# Patient Record
Sex: Male | Born: 1935 | Race: White | Hispanic: No | Marital: Married | Smoking: Former smoker
Health system: Southern US, Community
[De-identification: ages and names within clinical notes are randomized; demographics above are authoritative.]

## PROBLEM LIST (undated history)

## (undated) DIAGNOSIS — H3530 Unspecified macular degeneration: Secondary | ICD-10-CM

## (undated) DIAGNOSIS — M4800 Spinal stenosis, site unspecified: Secondary | ICD-10-CM

## (undated) DIAGNOSIS — I712 Thoracic aortic aneurysm, without rupture, unspecified: Secondary | ICD-10-CM

## (undated) DIAGNOSIS — I10 Essential (primary) hypertension: Secondary | ICD-10-CM

## (undated) DIAGNOSIS — I714 Abdominal aortic aneurysm, without rupture, unspecified: Secondary | ICD-10-CM

## (undated) DIAGNOSIS — I2510 Atherosclerotic heart disease of native coronary artery without angina pectoris: Secondary | ICD-10-CM

## (undated) DIAGNOSIS — I739 Peripheral vascular disease, unspecified: Secondary | ICD-10-CM

## (undated) DIAGNOSIS — R9431 Abnormal electrocardiogram [ECG] [EKG]: Secondary | ICD-10-CM

## (undated) DIAGNOSIS — E785 Hyperlipidemia, unspecified: Secondary | ICD-10-CM

## (undated) DIAGNOSIS — I219 Acute myocardial infarction, unspecified: Secondary | ICD-10-CM

## (undated) HISTORY — DX: Abdominal aortic aneurysm, without rupture, unspecified: I71.40

## (undated) HISTORY — DX: Hyperlipidemia, unspecified: E78.5

## (undated) HISTORY — DX: Thoracic aortic aneurysm, without rupture, unspecified: I71.20

## (undated) HISTORY — DX: Acute myocardial infarction, unspecified: I21.9

## (undated) HISTORY — PX: CATARACT EXTRACTION: SUR2

## (undated) HISTORY — DX: Abdominal aortic aneurysm, without rupture: I71.4

## (undated) HISTORY — DX: Atherosclerotic heart disease of native coronary artery without angina pectoris: I25.10

## (undated) HISTORY — DX: Abnormal electrocardiogram (ECG) (EKG): R94.31

## (undated) HISTORY — DX: Thoracic aortic aneurysm, without rupture: I71.2

## (undated) HISTORY — DX: Unspecified macular degeneration: H35.30

## (undated) HISTORY — DX: Spinal stenosis, site unspecified: M48.00

## (undated) HISTORY — DX: Essential (primary) hypertension: I10

## (undated) HISTORY — DX: Peripheral vascular disease, unspecified: I73.9

## (undated) HISTORY — PX: APPENDECTOMY: SHX54

---

## 1987-08-07 HISTORY — PX: OTHER SURGICAL HISTORY: SHX169

## 1993-08-06 HISTORY — PX: OTHER SURGICAL HISTORY: SHX169

## 1998-12-09 ENCOUNTER — Ambulatory Visit (HOSPITAL_COMMUNITY): Admission: RE | Admit: 1998-12-09 | Discharge: 1998-12-09 | Payer: Self-pay | Admitting: Gastroenterology

## 2004-05-11 ENCOUNTER — Ambulatory Visit (HOSPITAL_COMMUNITY): Admission: RE | Admit: 2004-05-11 | Discharge: 2004-05-11 | Payer: Self-pay | Admitting: Family Medicine

## 2004-08-14 ENCOUNTER — Ambulatory Visit: Payer: Self-pay | Admitting: Family Medicine

## 2004-09-01 ENCOUNTER — Ambulatory Visit: Payer: Self-pay | Admitting: Family Medicine

## 2004-09-26 ENCOUNTER — Ambulatory Visit: Payer: Self-pay | Admitting: Family Medicine

## 2004-10-02 ENCOUNTER — Ambulatory Visit: Payer: Self-pay | Admitting: Family Medicine

## 2004-10-30 ENCOUNTER — Ambulatory Visit: Payer: Self-pay | Admitting: Family Medicine

## 2005-01-09 ENCOUNTER — Ambulatory Visit: Payer: Self-pay | Admitting: Family Medicine

## 2005-04-19 ENCOUNTER — Ambulatory Visit: Payer: Self-pay | Admitting: Family Medicine

## 2005-04-30 ENCOUNTER — Ambulatory Visit: Payer: Self-pay | Admitting: Family Medicine

## 2005-06-11 ENCOUNTER — Ambulatory Visit: Payer: Self-pay | Admitting: Family Medicine

## 2005-08-06 DIAGNOSIS — I219 Acute myocardial infarction, unspecified: Secondary | ICD-10-CM

## 2005-08-06 HISTORY — DX: Acute myocardial infarction, unspecified: I21.9

## 2005-10-16 ENCOUNTER — Ambulatory Visit: Payer: Self-pay | Admitting: Family Medicine

## 2005-11-06 ENCOUNTER — Ambulatory Visit: Payer: Self-pay | Admitting: Family Medicine

## 2005-11-15 ENCOUNTER — Ambulatory Visit: Payer: Self-pay | Admitting: *Deleted

## 2005-11-16 ENCOUNTER — Inpatient Hospital Stay (HOSPITAL_COMMUNITY): Admission: EM | Admit: 2005-11-16 | Discharge: 2005-11-19 | Payer: Self-pay | Admitting: Emergency Medicine

## 2005-11-28 ENCOUNTER — Ambulatory Visit: Payer: Self-pay | Admitting: Family Medicine

## 2005-12-05 ENCOUNTER — Ambulatory Visit: Payer: Self-pay | Admitting: Cardiology

## 2005-12-25 ENCOUNTER — Ambulatory Visit: Payer: Self-pay | Admitting: Cardiology

## 2006-01-08 ENCOUNTER — Ambulatory Visit: Payer: Self-pay | Admitting: Cardiology

## 2006-01-29 ENCOUNTER — Ambulatory Visit: Payer: Self-pay | Admitting: Cardiology

## 2006-02-15 ENCOUNTER — Ambulatory Visit: Payer: Self-pay | Admitting: Family Medicine

## 2006-02-28 ENCOUNTER — Ambulatory Visit: Payer: Self-pay | Admitting: Family Medicine

## 2006-06-05 ENCOUNTER — Ambulatory Visit: Payer: Self-pay | Admitting: Family Medicine

## 2006-07-01 ENCOUNTER — Ambulatory Visit: Payer: Self-pay | Admitting: Family Medicine

## 2006-07-04 ENCOUNTER — Ambulatory Visit: Payer: Self-pay | Admitting: Cardiology

## 2006-10-07 ENCOUNTER — Ambulatory Visit: Payer: Self-pay | Admitting: Family Medicine

## 2006-10-22 ENCOUNTER — Ambulatory Visit: Payer: Self-pay | Admitting: Family Medicine

## 2006-10-29 ENCOUNTER — Ambulatory Visit: Payer: Self-pay | Admitting: Family Medicine

## 2006-11-19 ENCOUNTER — Encounter: Admission: RE | Admit: 2006-11-19 | Discharge: 2006-11-19 | Payer: Self-pay | Admitting: Gastroenterology

## 2006-12-03 ENCOUNTER — Ambulatory Visit: Payer: Self-pay | Admitting: Family Medicine

## 2006-12-25 ENCOUNTER — Ambulatory Visit: Payer: Self-pay | Admitting: Vascular Surgery

## 2007-01-20 ENCOUNTER — Ambulatory Visit: Payer: Self-pay | Admitting: Family Medicine

## 2007-02-18 ENCOUNTER — Inpatient Hospital Stay (HOSPITAL_COMMUNITY): Admission: EM | Admit: 2007-02-18 | Discharge: 2007-02-20 | Payer: Self-pay | Admitting: Emergency Medicine

## 2007-02-18 ENCOUNTER — Encounter: Payer: Self-pay | Admitting: Cardiology

## 2007-02-18 ENCOUNTER — Ambulatory Visit: Payer: Self-pay | Admitting: Cardiology

## 2007-03-04 ENCOUNTER — Ambulatory Visit: Payer: Self-pay | Admitting: Family Medicine

## 2007-03-28 ENCOUNTER — Ambulatory Visit: Payer: Self-pay | Admitting: Cardiology

## 2007-05-05 ENCOUNTER — Encounter: Payer: Self-pay | Admitting: Cardiology

## 2007-07-01 ENCOUNTER — Encounter: Admission: RE | Admit: 2007-07-01 | Discharge: 2007-07-01 | Payer: Self-pay | Admitting: Vascular Surgery

## 2007-07-01 ENCOUNTER — Ambulatory Visit: Payer: Self-pay | Admitting: Vascular Surgery

## 2007-08-07 HISTORY — PX: JOINT REPLACEMENT: SHX530

## 2007-10-16 ENCOUNTER — Ambulatory Visit: Payer: Self-pay | Admitting: Cardiology

## 2007-10-17 ENCOUNTER — Ambulatory Visit: Payer: Self-pay | Admitting: Cardiology

## 2007-12-01 ENCOUNTER — Inpatient Hospital Stay (HOSPITAL_COMMUNITY): Admission: RE | Admit: 2007-12-01 | Discharge: 2007-12-05 | Payer: Self-pay | Admitting: Orthopedic Surgery

## 2008-03-16 ENCOUNTER — Inpatient Hospital Stay (HOSPITAL_COMMUNITY): Admission: RE | Admit: 2008-03-16 | Discharge: 2008-03-19 | Payer: Self-pay | Admitting: Orthopedic Surgery

## 2008-05-25 ENCOUNTER — Encounter: Admission: RE | Admit: 2008-05-25 | Discharge: 2008-05-25 | Payer: Self-pay | Admitting: Vascular Surgery

## 2008-05-25 ENCOUNTER — Ambulatory Visit: Payer: Self-pay | Admitting: Vascular Surgery

## 2008-07-22 ENCOUNTER — Ambulatory Visit: Payer: Self-pay | Admitting: Cardiology

## 2008-08-25 ENCOUNTER — Encounter: Admission: RE | Admit: 2008-08-25 | Discharge: 2008-08-25 | Payer: Self-pay | Admitting: Allergy

## 2008-11-30 ENCOUNTER — Encounter: Admission: RE | Admit: 2008-11-30 | Discharge: 2008-11-30 | Payer: Self-pay | Admitting: Vascular Surgery

## 2008-11-30 ENCOUNTER — Ambulatory Visit: Payer: Self-pay | Admitting: Vascular Surgery

## 2008-12-27 ENCOUNTER — Encounter: Payer: Self-pay | Admitting: Cardiology

## 2008-12-28 ENCOUNTER — Encounter: Payer: Self-pay | Admitting: Cardiology

## 2008-12-29 ENCOUNTER — Encounter: Payer: Self-pay | Admitting: Cardiology

## 2009-02-22 DIAGNOSIS — I10 Essential (primary) hypertension: Secondary | ICD-10-CM

## 2009-02-22 DIAGNOSIS — E785 Hyperlipidemia, unspecified: Secondary | ICD-10-CM

## 2009-02-22 DIAGNOSIS — R9431 Abnormal electrocardiogram [ECG] [EKG]: Secondary | ICD-10-CM

## 2009-02-22 DIAGNOSIS — I2510 Atherosclerotic heart disease of native coronary artery without angina pectoris: Secondary | ICD-10-CM

## 2009-02-22 DIAGNOSIS — I716 Thoracoabdominal aortic aneurysm, without rupture: Secondary | ICD-10-CM

## 2009-02-22 IMAGING — CR DG PORTABLE PELVIS
1 series · 1 of 1 positions shown · non-contrast
Comparison: None

CLINICAL DATA: Osteoarthritis right hip.  Right hip replacement.

PORTABLE PELVIS

[view not recorded]
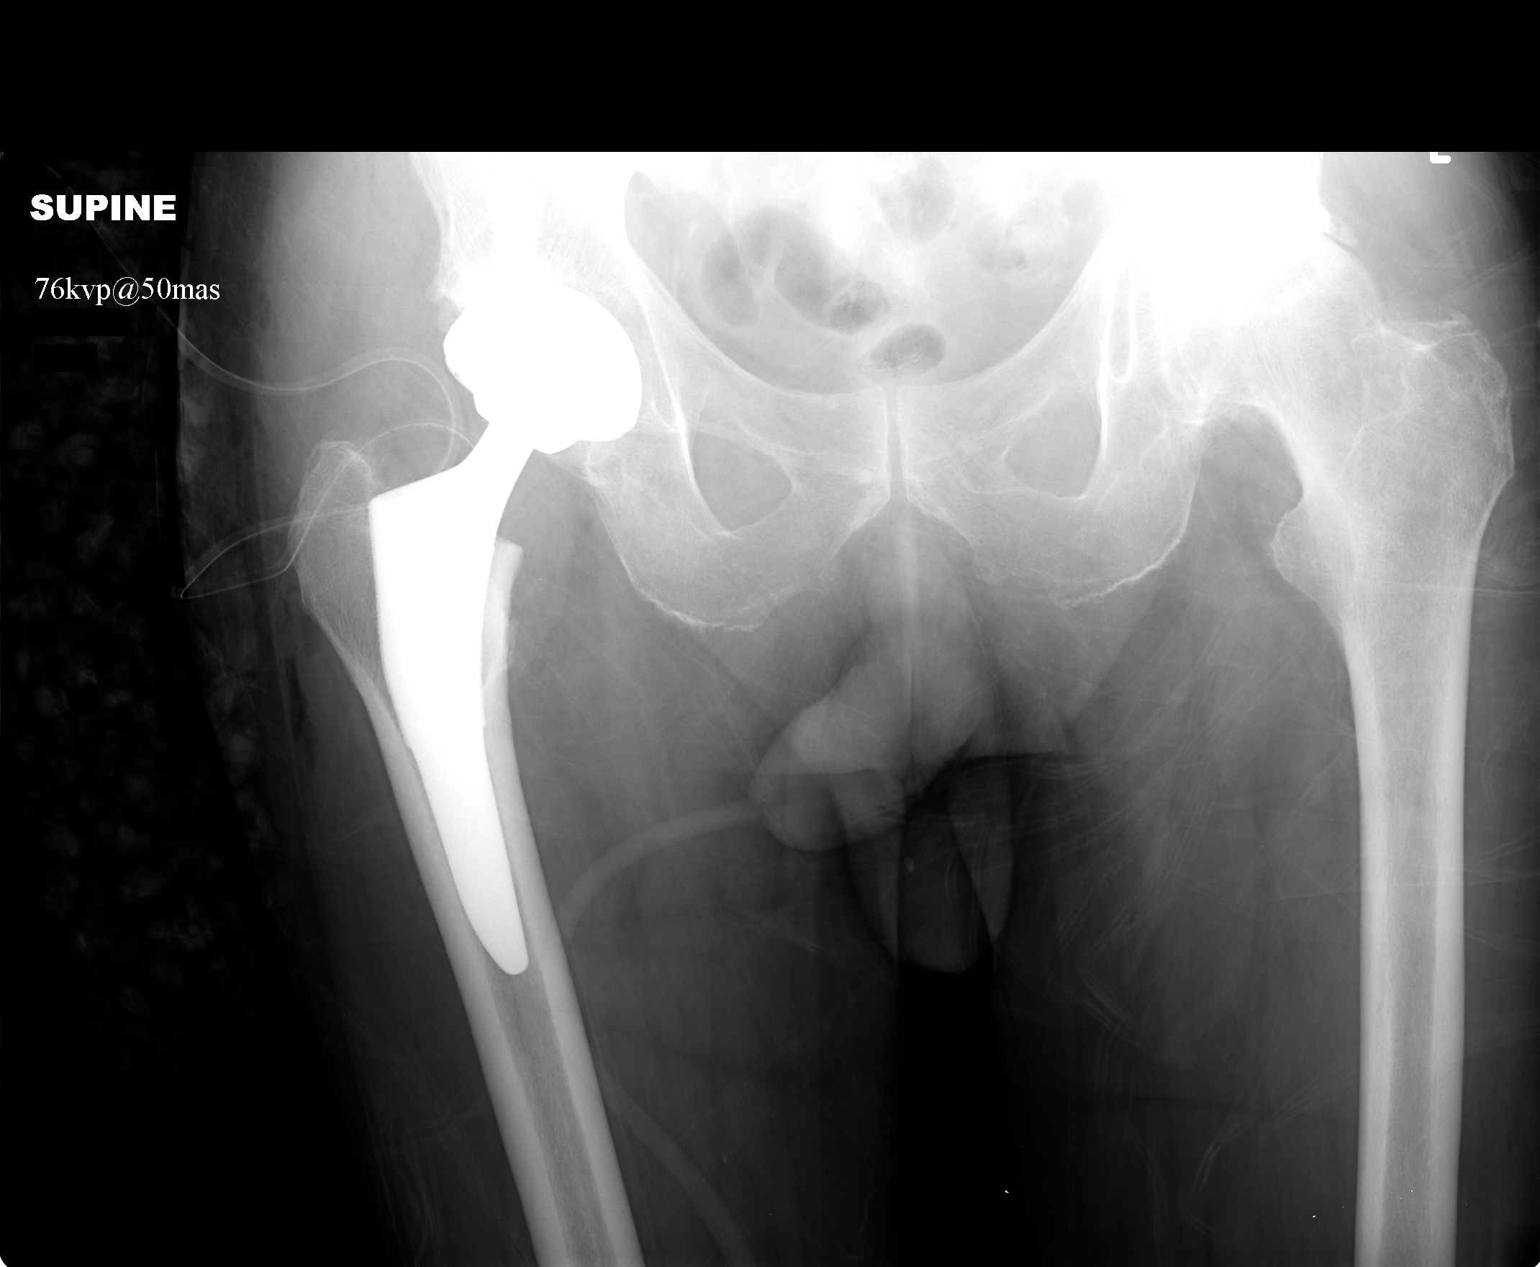

[1 of 1 positions shown; findings below may reference images not displayed]

FINDINGS: Right hip replacement has been performed with
satisfactory position of the acetabular and femoral prosthesis.
There is no fracture or dislocation.

Moderate to advanced degenerative changes are also noted in the
left hip.
IMPRESSION: Satisfactory right hip replacement

## 2009-05-10 ENCOUNTER — Ambulatory Visit: Payer: Self-pay | Admitting: Cardiology

## 2009-06-01 ENCOUNTER — Ambulatory Visit: Payer: Self-pay | Admitting: Vascular Surgery

## 2009-09-01 ENCOUNTER — Telehealth (INDEPENDENT_AMBULATORY_CARE_PROVIDER_SITE_OTHER): Payer: Self-pay | Admitting: *Deleted

## 2009-10-04 ENCOUNTER — Inpatient Hospital Stay (HOSPITAL_COMMUNITY): Admission: EM | Admit: 2009-10-04 | Discharge: 2009-10-06 | Payer: Self-pay | Admitting: Emergency Medicine

## 2009-10-04 ENCOUNTER — Ambulatory Visit: Payer: Self-pay | Admitting: Cardiovascular Disease

## 2009-10-04 ENCOUNTER — Encounter: Payer: Self-pay | Admitting: Cardiology

## 2009-10-05 ENCOUNTER — Ambulatory Visit: Payer: Self-pay | Admitting: Vascular Surgery

## 2009-10-05 ENCOUNTER — Encounter: Payer: Self-pay | Admitting: Cardiology

## 2009-10-25 ENCOUNTER — Ambulatory Visit: Payer: Self-pay | Admitting: Cardiology

## 2009-10-25 DIAGNOSIS — I4891 Unspecified atrial fibrillation: Secondary | ICD-10-CM | POA: Insufficient documentation

## 2009-10-25 DIAGNOSIS — I714 Abdominal aortic aneurysm, without rupture, unspecified: Secondary | ICD-10-CM | POA: Insufficient documentation

## 2009-10-25 DIAGNOSIS — R0602 Shortness of breath: Secondary | ICD-10-CM

## 2009-10-26 ENCOUNTER — Encounter: Payer: Self-pay | Admitting: Cardiology

## 2009-10-31 ENCOUNTER — Encounter: Payer: Self-pay | Admitting: Cardiology

## 2009-11-01 ENCOUNTER — Encounter (INDEPENDENT_AMBULATORY_CARE_PROVIDER_SITE_OTHER): Payer: Self-pay | Admitting: *Deleted

## 2009-11-03 ENCOUNTER — Encounter: Payer: Self-pay | Admitting: Cardiology

## 2009-11-09 ENCOUNTER — Encounter: Payer: Self-pay | Admitting: Cardiology

## 2009-11-09 ENCOUNTER — Telehealth (INDEPENDENT_AMBULATORY_CARE_PROVIDER_SITE_OTHER): Payer: Self-pay | Admitting: *Deleted

## 2009-11-09 DIAGNOSIS — R930 Abnormal findings on diagnostic imaging of skull and head, not elsewhere classified: Secondary | ICD-10-CM | POA: Insufficient documentation

## 2009-11-18 ENCOUNTER — Telehealth (INDEPENDENT_AMBULATORY_CARE_PROVIDER_SITE_OTHER): Payer: Self-pay | Admitting: *Deleted

## 2009-11-29 ENCOUNTER — Encounter: Payer: Self-pay | Admitting: Cardiology

## 2009-12-02 ENCOUNTER — Encounter: Payer: Self-pay | Admitting: Cardiology

## 2009-12-08 ENCOUNTER — Encounter: Payer: Self-pay | Admitting: Cardiology

## 2009-12-19 ENCOUNTER — Encounter: Payer: Self-pay | Admitting: Cardiology

## 2009-12-27 ENCOUNTER — Encounter: Payer: Self-pay | Admitting: Cardiology

## 2009-12-27 ENCOUNTER — Emergency Department (HOSPITAL_COMMUNITY): Admission: EM | Admit: 2009-12-27 | Discharge: 2009-12-27 | Payer: Self-pay | Admitting: Emergency Medicine

## 2009-12-28 ENCOUNTER — Encounter: Payer: Self-pay | Admitting: Cardiology

## 2010-01-05 ENCOUNTER — Ambulatory Visit: Payer: Self-pay | Admitting: Physician Assistant

## 2010-01-05 ENCOUNTER — Encounter: Payer: Self-pay | Admitting: Cardiology

## 2010-01-09 ENCOUNTER — Ambulatory Visit (HOSPITAL_COMMUNITY): Admission: RE | Admit: 2010-01-09 | Discharge: 2010-01-09 | Payer: Self-pay | Admitting: Ophthalmology

## 2010-01-12 ENCOUNTER — Ambulatory Visit (HOSPITAL_COMMUNITY): Admission: AD | Admit: 2010-01-12 | Discharge: 2010-01-13 | Payer: Self-pay | Admitting: Ophthalmology

## 2010-01-18 ENCOUNTER — Telehealth (INDEPENDENT_AMBULATORY_CARE_PROVIDER_SITE_OTHER): Payer: Self-pay | Admitting: *Deleted

## 2010-01-19 ENCOUNTER — Encounter: Payer: Self-pay | Admitting: Cardiology

## 2010-01-23 ENCOUNTER — Observation Stay (HOSPITAL_COMMUNITY): Admission: RE | Admit: 2010-01-23 | Discharge: 2010-01-24 | Payer: Self-pay | Admitting: Ophthalmology

## 2010-01-23 ENCOUNTER — Encounter: Payer: Self-pay | Admitting: Cardiology

## 2010-05-03 ENCOUNTER — Encounter: Payer: Self-pay | Admitting: Cardiology

## 2010-05-03 ENCOUNTER — Ambulatory Visit: Payer: Self-pay | Admitting: Vascular Surgery

## 2010-05-03 ENCOUNTER — Encounter: Admission: RE | Admit: 2010-05-03 | Discharge: 2010-05-03 | Payer: Self-pay | Admitting: Vascular Surgery

## 2010-05-16 ENCOUNTER — Ambulatory Visit: Payer: Self-pay | Admitting: Cardiology

## 2010-08-06 HISTORY — PX: ENUCLEATION: SHX628

## 2010-08-27 ENCOUNTER — Encounter: Payer: Self-pay | Admitting: Vascular Surgery

## 2010-09-07 NOTE — Progress Notes (Signed)
Summary: Office Visit/ VV OFFICE VISIT  Office Visit/ VV OFFICE VISIT   Imported By: Dorise Hiss 05/16/2010 10:15:31  _____________________________________________________________________  External Attachment:    Type:   Image     Comment:   External Document

## 2010-09-07 NOTE — Letter (Signed)
Summary: External Correspondence/ OFFICE VISIT DR. HENDERSON  External Correspondence/ OFFICE VISIT DR. HENDERSON   Imported By: Dorise Hiss 01/05/2010 14:22:33  _____________________________________________________________________  External Attachment:    Type:   Image     Comment:   External Document

## 2010-09-07 NOTE — Progress Notes (Signed)
Summary: pulmonology referral  ---- Converted from flag ---- ---- 10/25/2009 3:13 PM, Hoover Brunette, LPN wrote: If BNP normal, then referral to Pulmonology for shortness of breath & pulmonary infiltrates. ------------------------------  Phone Note Outgoing Call   Summary of Call: Appointment scheduled for:    Friday, April 29 at 3:00 with Dr. Orson Aloe.   Left message to return call.  Hoover Brunette, LPN  November 09, 4096 10:18 AM  Wife notified.      Initial call taken by: Hoover Brunette, LPN,  November 09, 2009 10:49 AM

## 2010-09-07 NOTE — Letter (Signed)
Summary: Engineer, materials at [facility]  [address]   [place], [place] [address]   Phone: [phone]  Fax: [phone]        November 01, 2009 MRN: [id number]   [name] [name] [address] [place], [place]  [address]   Dear [name],  Your test ordered by Selena Batten has been reviewed by your physician (or physician assistant) and was found to be normal or stable. Your physician (or physician assistant) felt no changes were needed at this time.  ____ Echocardiogram  ____ Cardiac Stress Test  __X__ Lab Work  ____ Peripheral vascular study of arms, legs or neck  ____ CT scan or X-ray  ____ Lung or Breathing test  ____ Other:   Thank you.   Hoover Brunette, LPN    Duane Boston, M.D., F.A.C.C. Thressa Sheller, M.D., F.A.C.C. Oneal Grout, M.D., F.A.C.C. Cheree Ditto, M.D., F.A.C.C. Daiva Nakayama, M.D., F.A.C.C. Kenney Houseman, M.D., F.A.C.C. Jeanne Ivan, PA-C

## 2010-09-07 NOTE — Progress Notes (Signed)
  Reqived Request from [facility] sent to [facility] Mould [facility]  January 18, 2010 3:17 PM    Appended Document:  Recieved paper back from [facility] for Doc to Initail paper from [employer]..I am unsure of who is to initial and so is Renee, I am sending the paper back to [facility] to [employer]

## 2010-09-07 NOTE — Medication Information (Signed)
Summary: RX Folder/ [employer] ANNUAL ASSESSMENT  RX Folder/ [facility] ANNUAL ASSESSMENT   Imported By: Dorise Hiss 01/11/2010 12:21:34  _____________________________________________________________________  External Attachment:    Type:   Image     Comment:   External Document

## 2010-09-07 NOTE — Medication Information (Signed)
Summary: RX Folder/ [facility] ([facility])  RX Folder/ [facility] ([facility])   Imported By: Dorise Hiss 01/19/2010 11:40:54  _____________________________________________________________________  External Attachment:    Type:   Image     Comment:   External Document
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
## 2010-09-07 NOTE — Miscellaneous (Signed)
Summary: Rehab Report/ CARDIAC REHAB DISCHARGE SUMMARY  Rehab Report/ CARDIAC REHAB DISCHARGE SUMMARY   Imported By: Dorise Hiss 01/26/2010 09:17:00  _____________________________________________________________________  External Attachment:    Type:   Image     Comment:   External Document

## 2010-09-07 NOTE — Miscellaneous (Signed)
Summary: Rehab Report/ CARDIAC REHAB PROGRESS REPORT  Rehab Report/ CARDIAC REHAB PROGRESS REPORT   Imported By: Dorise Hiss 11/04/2009 09:34:54  _____________________________________________________________________  External Attachment:    Type:   Image     Comment:   External Document

## 2010-09-07 NOTE — Medication Information (Signed)
Summary: RX Folder/ AMLODIPINE BESYLATE  RX Folder/ AMLODIPINE BESYLATE   Imported By: Dorise Hiss 12/20/2009 09:10:48  _____________________________________________________________________  External Attachment:    Type:   Image     Comment:   External Document

## 2010-09-07 NOTE — Letter (Signed)
Summary: External Correspondence  External Correspondence   Imported By: Zachary George 10/31/2009 09:41:32  _____________________________________________________________________  External Attachment:    Type:   Image     Comment:   External Document  Appended Document: External Correspondence patient is okay to start rehabilitation is not hypoxic anymore.  Appended Document: External Correspondence Note faxed back to chronic disease.

## 2010-09-07 NOTE — Assessment & Plan Note (Signed)
Summary: 2 wk post Hosp fu d/c [facility] 10/05/2009 vs   Visit Type:  Follow-up Primary Provider:  Dr Lysbeth Galas  CC:  hospital follow-up visit.  History of Present Illness: the patient is an 77 year old male with a history of thoracic abdominal aneurysm. The patient was recently admitted to [facility] for acute onset of social chest pain. The patient was referred for cardiac catheterization and was found to have severe distal left circumflex stenosis and underwent successful percutaneous intervention using a bare-metal stent. He also has mild-to-moderate left anterior descending and right coronary stenosis. The patient complains of shortness of breath on minimal exertion. He states he feels like "a rotten dog". The patient had a CT scan done at [facility] and now is found to have an enlarging aneurysm thoracic aneurysm is 4.3 cm and abdominal aneurysm is 5.4 cm. Of note also the patient was found to have posterior right upper lobe ground glass opacities and interlobular septal thickening of the lung bases.  The patient reports no lower extremity edema. He reports no palpitations presyncope or syncope. He states he feels fullness in his lower abdomen associated with bloating. He is constipated and has to use MiraLax.  The patient agreed episode of atrial fibrillation during presentation was acute coronary syndrome. He was placed on amiodarone therapy which was discontinued.  Preventive Screening-Counseling & Management  Alcohol-Tobacco     Smoking Status: quit     Year Quit: 1950  Current Medications (verified): 1)  Amlodipine Besylate 5 Mg Tabs (Amlodipine Besylate) .... Take 1 Tablet By Mouth Once A Day 2)  Loratadine 10 Mg Tabs (Loratadine) .... Take 1 Tablet By Mouth Once A Day 3)  Alphagan P 0.15 % Soln (Brimonidine Tartrate) .... One Gtt To Ou Two Times A Day 4)  Pred Mild 0.12 % Susp (Prednisolone Acetate) .... One Gtt Ou Daily 5)  Aspirin 81 Mg Tbec (Aspirin) .... Take 1  Tablet By Mouth Once A Day 6)  Macuvite Eye Care  Tabs (Multiple Vitamins-Minerals) .... Take 1 Tablet By Mouth Two Times A Day 7)  Prostate  Tabs (Specialty Vitamins Products) .... Take 1 Tablet By Mouth Once A Day 8)  Folbee 2.5-25-1 Mg Tabs (Folic Acid-Vit B6-Vit B12) .... Take 1 Tablet By Mouth Once A Day 9)  Multivitamins  Tabs (Multiple Vitamin) .... Take 1 Tablet By Mouth Once A Day 10)  Fish Oil 1200 Mg Caps (Omega-3 Fatty Acids) .... Take 1 Tablet By Mouth Once A Day 11)  Furosemide 20 Mg Tabs (Furosemide) .... Take 1 Tablet By Mouth Once A Day Times Three Days 12)  Flomax 0.4 Mg Caps (Tamsulosin Hcl) .... Take 1 Tablet By Mouth Once A Day 13)  Tylenol 325 Mg Tabs (Acetaminophen) .... Take One By Mouth Every Four Hours As Needed 14)  Plavix 75 Mg Tabs (Clopidogrel Bisulfate) .... Take 1 Tablet By Mouth Once A Day 15)  Nitrostat 0.4 Mg Subl (Nitroglycerin) .... Use As Directed 16)  Simvastatin 40 Mg Tabs (Simvastatin) .... Take 1 Tablet By Mouth Once A Day 17)  Dorzolamide Hcl 2 % Soln (Dorzolamide Hcl) .... One Drop Ou Two Times A Day  Allergies (verified): 1)  ! Cipro  Comments:  Nurse/Medical Assistant: The patient's medications and allergies were reviewed with the patient and were updated in the Medication and Allergy Lists. Bottles,list reviewed.  Past History:  Past Medical History: Last updated: 05/10/2009 HYPERLIPIDEMIA-MIXED (ICD-272.4) ABNORMAL EKG (ICD-794.31) HYPERTENSION, UNSPECIFIED (ICD-401.9) THORACIC AORTIC ANEURYSM (ICD-441.2) CAD, NATIVE VESSEL (ICD-414.01)  1. History of coronary artery disease with non-ST elevation myocardial     infarction and stent to the circumflex coronary artery. 2. Normal left ventricular function. 3. Peripheral vascular disease with thoracic abdominal aneurysm     followed by Dr. [name]. 4. Hypertension with borderline control. 5. Abnormal EKG with left ventricular hypertrophy and secondary     repolarization changes,  unchanged. 6. Dyslipidemia. 7. History of remote tobacco use.     Past Surgical History: Last updated: 02/22/2009 Hip Arthroplasty-Total Appendectomy Cataract Extraction Right arm nerve transfer.  Removal of bone chips in right arm  Family History: Last updated: 02/22/2009 Family History of Cancer:  Family History of CVA or Stroke:  Family History of Coronary Artery Disease:   Social History: Last updated: 02/22/2009 Retired  --- [employer] vet  Married  Tobacco Use - Former.  - 1950's   Risk Factors: Smoking Status: quit (10/25/2009)  Review of Systems       The patient complains of fatigue, shortness of breath, abdominal pain, and muscle weakness.  The patient denies malaise, fever, weight gain/loss, vision loss, decreased hearing, hoarseness, chest pain, palpitations, prolonged cough, wheezing, sleep apnea, coughing up blood, blood in stool, nausea, vomiting, diarrhea, heartburn, incontinence, blood in urine, joint pain, leg swelling, rash, skin lesions, headache, fainting, dizziness, depression, anxiety, enlarged lymph nodes, easy bruising or bleeding, and environmental allergies.    Vital Signs:  Patient profile:   77 year old male Height:      67 inches Weight:      1421 pounds Pulse rate:   77 / minute BP sitting:   105 / 64  (left arm) Cuff size:   regular  Vitals Entered By: Carlye Grippe (October 25, 2009 2:35 PM) CC: hospital follow-up visit   Physical Exam  Additional Exam:  General: Pale-appearing white male in no distress head: Normocephalic and atraumatic eyes PERRLA/EOMI intact, conjunctiva and lids normal nose: No deformity or lesions mouth normal dentition, normal posterior pharynx neck: Supple, no JVD.  No masses, thyromegaly or abnormal cervical nodes lungs: crackles in the left lower base and scattered rhonchi in the upper lobes..  Normal percussion heart: regular rate and rhythm with normal S1 and S2, no S3 or S4.  PMI is normal.  No pathological  murmurs abdomen: Normal bowel sounds, abdomen is soft and nontender without masses, organomegaly or hernias noted.  No hepatosplenomegaly musculoskeletal: Back normal, normal gait muscle strength and tone normal pulsus: Pulse is normal in all 4 extremities Extremities: No peripheral pitting edema neurologic: Alert and oriented x 3 skin: Intact without lesions or rashes cervical nodes: No significant adenopathy psychologic: Normal affect    Impression & Recommendations:  Problem # 1:  AAA (ICD-441.4) the patient has a thoracoabdominal aneurysm. This is being followed. Aneurysm is 5.4 cm. Given the patient's advanced age however I doubt to be a candidate for intervention in the near future. Is followed by vascular surgery.  Problem # 2:  CAD, NATIVE VESSEL (ICD-414.01) the patient underwent a bare-metal stent placement to the distal circumflex coronary artery.he will continue on Plavix.I do not consider the patient candidate for Coumadin therapy in the setting of his large abdominal aortic aneurysm and also because of his advanced age. Plavix can likely be extended beyond the treatment course of 30 days His updated medication list for this problem includes:    Amlodipine Besylate 5 Mg Tabs (Amlodipine besylate) .[name]... Take 1 tablet by mouth once a day    Aspirin 81 Mg Tbec (Aspirin) .Marland KitchenMarland KitchenMarland KitchenMarland Kitchen  Take 1 tablet by mouth once a day    Plavix 75 Mg Tabs (Clopidogrel bisulfate) .[name]... Take 1 tablet by mouth once a day    Nitrostat 0.4 Mg Subl (Nitroglycerin) ..... Use as directed  Orders: T-BNP  (B Natriuretic Peptide) (04540-98119) T-CBC No Diff ([id number]) T-Basic Metabolic Panel ([id number])  Problem # 3:  HYPERTENSION, UNSPECIFIED (ICD-401.9) Assessment: Improved  His updated medication list for this problem includes:    Amlodipine Besylate 5 Mg Tabs (Amlodipine besylate) .[name]... Take 1 tablet by mouth once a day    Aspirin 81 Mg Tbec (Aspirin) .[name]... Take 1 tablet by mouth once a day     Furosemide 20 Mg Tabs (Furosemide) .[name]... Take 1 tablet by mouth once a day times three days  Orders: T-BNP  (B Natriuretic Peptide) ([id number]) T-CBC No Diff ([id number]) T-Basic Metabolic Panel ([id number])  Problem # 4:  ATRIAL FIBRILLATION, PAROXYSMAL (ICD-427.31) the patient is single episode paroxysmal atrial fibrillation. As outlined above I do not think is a Coumadin candidate and we will continue Plavix for now. His updated medication list for this problem includes:    Aspirin 81 Mg Tbec (Aspirin) .[name]... Take 1 tablet by mouth once a day    Plavix 75 Mg Tabs (Clopidogrel bisulfate) .[name]... Take 1 tablet by mouth once a day  Problem # 5:  DYSPNEA (ICD-786.05) the patient has dyspnea on minimal exertion. We will get a BNP level to make sure that there is no heart failure. I doubt this. Much more like that the patient is deconditioned and also some intrinsic lung disease. His updated medication list for this problem includes:    Amlodipine Besylate 5 Mg Tabs (Amlodipine besylate) .[name]... Take 1 tablet by mouth once a day    Aspirin 81 Mg Tbec (Aspirin) .[name]... Take 1 tablet by mouth once a day    Furosemide 20 Mg Tabs (Furosemide) .[name]... Take 1 tablet by mouth once a day times three days  Patient Instructions: 1)  Labs:  CBC, BMET, BNP 2)  If BNP normal, then referral to Pulmonology for shortness of breath & pulmonary infiltrates. 3)  Follow up in  6 months.

## 2010-09-07 NOTE — Miscellaneous (Signed)
Summary: Rehab Report/ CARDIAC REHAB PROGRESS REPORT  Rehab Report/ CARDIAC REHAB PROGRESS REPORT   Imported By: Dorise Hiss 12/01/2009 08:22:39  _____________________________________________________________________  External Attachment:    Type:   Image     Comment:   External Document

## 2010-09-07 NOTE — Progress Notes (Signed)
Summary: dosage change  Phone Note From Other Clinic   Caller: recieved fax from [facility]  Summary of Call: Pt. currently on Amlodipine 5mg  two times a day.  They are requesting to change dose to 10mg  daily.  Requesting to change due to cost and simplifying pt.'s drug regimen.    Initial call taken by: Hoover Brunette, LPN,  September 01, 2009 2:11 PM  Follow-up for Phone Call        Agree. Change to 10mg  by mouth once daily amlodipine. [facility] notified by fax. Follow-up by: Lewayne Bunting, MD, [facility],  September 01, 2009 3:52 PM    New/Updated Medications: AMLODIPINE BESYLATE 10 MG TABS (AMLODIPINE BESYLATE) Take 1 tablet by mouth once a day

## 2010-09-07 NOTE — Progress Notes (Signed)
Summary: Plavix question       Phone Note Other Incoming   Caller: wife - Nurse, mental health of Call: States someone from [facility] called to see if Sie still on Plavix.  Has just had bare metal stent x 1 around 3/1.  Please advise on length to be on medication.   Hoover Brunette, LPN  November 18, 2009 4:22 PM   Follow-up for Phone Call        Read my office note see  Problem#2 in Conclusion.  Follow-up by: Lewayne Bunting, MD, [facility],  November 22, 2009 2:53 PM  Additional Follow-up for Phone Call Additional follow up Details #1::        Left message to return call.  Hoover Brunette, LPN  November 24, 2009 2:04 PM   Wife notified will extend Plavix beyond 30 day treatment time.  Probably be on indefinitaly.   Hoover Brunette, LPN  November 24, 2009 3:46 PM

## 2010-09-07 NOTE — Miscellaneous (Signed)
Summary: Orders Update - Pulmonology referral  Clinical Lists Changes  Problems: Added new problem of NONSPCIFC ABN FINDING RAD & OTH EXAM LUNG FIELD (ICD-793.1) Orders: Added new Referral order of Misc. Referral (Misc. Ref) - Signed

## 2010-09-07 NOTE — Assessment & Plan Note (Signed)
Summary: [facility] 5/24   Visit Type:  hospital follow-up Primary Provider:  Dr Lysbeth Galas   History of Present Illness: 77 year old male, well-known to Dr. Andee Lineman, last seen here in clinic on March 22. He has known CAD, and underwent successful bare metal stenting of the distal circumflex artery,  earlier this year. He also has a known thoracic abdominal aneurysm, followed by Dr. Cari Caraway, in [place].  When last seen here in clinic, Dr. Andee Lineman recommended a referral to Dr. Cherie Ouch, for a formal pulmonary evaluation. Patient was apparently seen on April 29. He then was seen at [facility] ED on the 24th, with complaint of chest pain. One set of cardiac markers was normal. We do not have an EKG from that date. However, an EKG here today shows chronic, large symmetric T wave inversion in the anterolateral and inferior leads, essentially unchanged from early March.  Clinically, the patient denies any recurrent chest pain. However, he complains of persistent shortness of breath, with an associated cough. A chest x-ray while at [facility] ED yielding emphysema and chronically increased interstitial markings, with mild peribronchial thickening. There was also a vague retrocardiac opacity, possibly reflecting atelectasis versus pneumonia.  Current Medications (verified): 1)  Amlodipine Besylate 5 Mg Tabs (Amlodipine Besylate) .... Take 1 Tablet By Mouth Once A Day 2)  Loratadine 10 Mg Tabs (Loratadine) .... Take 1 Tablet By Mouth Once A Day 3)  Alphagan P 0.15 % Soln (Brimonidine Tartrate) .... Use As Directed 4)  Pred Mild 0.12 % Susp (Prednisolone Acetate) .... Use As Directed 5)  Aspirin 81 Mg Tbec (Aspirin) .... Take 1 Tablet By Mouth Once A Day 6)  Macuvite Eye Care  Tabs (Multiple Vitamins-Minerals) .... Take 1 Tablet By Mouth Two Times A Day 7)  Prostate  Tabs (Specialty Vitamins Products) .... Take 2 Tablet By Mouth Once A Day 8)  Folbee 2.5-25-1 Mg Tabs (Folic Acid-Vit B6-Vit B12) ....  Take 1 Tablet By Mouth Once A Day 9)  Multivitamins  Tabs (Multiple Vitamin) .... Take 1 Tablet By Mouth Once A Day 10)  Fish Oil 1200 Mg Caps (Omega-3 Fatty Acids) .... Take 1 Tablet By Mouth Once A Day 11)  Plavix 75 Mg Tabs (Clopidogrel Bisulfate) .... Take 1 Tablet By Mouth Once A Day 12)  Nitrostat 0.4 Mg Subl (Nitroglycerin) .... Use As Directed 13)  Simvastatin 40 Mg Tabs (Simvastatin) .... Take 1 Tablet By Mouth Once A Day 14)  Dorzolamide Hcl 2 % Soln (Dorzolamide Hcl) .... Use As Directed 15)  Levothyroxine Sodium 50 Mcg Tabs (Levothyroxine Sodium) .... Take 1 Tablet By Mouth Once A Day 16)  Acetazolamide 125 Mg Tabs (Acetazolamide) .... Take 1 Tablet By Mouth Two Times A Day  Allergies (verified): 1)  ! Cipro  Comments:  Nurse/Medical Assistant: The patient's medications and allergies were reviewed with the patient and were updated in the Medication and Allergy Lists. List reviewed.  Past History:  Past Medical History: Last updated: 05/10/2009 HYPERLIPIDEMIA-MIXED (ICD-272.4) ABNORMAL EKG (ICD-794.31) HYPERTENSION, UNSPECIFIED (ICD-401.9) THORACIC AORTIC ANEURYSM (ICD-441.2) CAD, NATIVE VESSEL (ICD-414.01)  1. History of coronary artery disease with non-ST elevation myocardial     infarction and stent to the circumflex coronary artery. 2. Normal left ventricular function. 3. Peripheral vascular disease with thoracic abdominal aneurysm     followed by Dr. [name]. 4. Hypertension with borderline control. 5. Abnormal EKG with left ventricular hypertrophy and secondary     repolarization changes, unchanged. 6. Dyslipidemia. 7. History of remote tobacco use.  Review of Systems       No fevers, chills, hemoptysis, dysphagia, melena, hematocheezia, hematuria, rash, claudication, orthopnea, pnd, pedal edema. All other systems negative.   Vital Signs:  Patient profile:   77 year old male Height:      67 inches Weight:      138 pounds BMI:     21.69 Pulse  rate:   58 / minute BP sitting:   114 / 68  (left arm)  Vitals Entered By: Carlye Grippe (January 05, 2010 1:38 PM)  Physical Exam  Additional Exam:  GEN: 77 year old male, sitting upright, in no distress HEENT: NCAT,PERRLA,EOMI NECK: no JVD; no TM LUNGS: CTA bilaterally HEART: RRR (S1S2); no significant murmurs; no rubs; no gallops ABD: soft, NT; intact BS EXT: no significant peripheral edema SKIN: warm, dry MUSC: no obvious deformity NEURO: A/O (x3)     EKG  Procedure date:  01/05/2010  Findings:      sinus bradycardia at 57 bpm with first degree AV block; chronic symmetric T wave inversion in inferior and anterolateral leads  Impression & Recommendations:  Problem # 1:  DYSPNEA (ICD-786.05) recommend return back to Dr. Cherie Ouch for further recommendations. No definite evidence of accelerated angina, based on current EKG. Patient's symptoms essentially are of persistent shortness of breath. In light of recent abnormal CXR, suggestive of chronic underlying lung disease, recommend further workup, per Dr. Orson Aloe. We'll plan return clinic visit with Dr. Andee Lineman in 3 months.  Problem # 2:  ATRIAL FIBRILLATION, PAROXYSMAL (ICD-427.31) maintaining normal sinus rhythm. As previously noted, patient is not a suitable candidate for Coumadin. Continue aspirin/Plavix.   His updated medication list for this problem includes:    Aspirin 81 Mg Tbec (Aspirin) .[name]... Take 1 tablet by mouth once a day    Plavix 75 Mg Tabs (Clopidogrel bisulfate) .[name]... Take 1 tablet by mouth once a day  Orders: EKG w/ Interpretation (93000)  Problem # 3:  CAD, NATIVE VESSEL (ICD-414.01) no recurrent chest pain, since visit to [facility] ED on May 24th. EKG today shows chronic changes, as compared to previous study from early March. Therefore, continued conservative management and close clinical followup.  His updated medication list for this problem includes:    Amlodipine Besylate 5 Mg Tabs (Amlodipine  besylate) .[name]... Take 1 tablet by mouth once a day    Aspirin 81 Mg Tbec (Aspirin) .[name]... Take 1 tablet by mouth once a day    Plavix 75 Mg Tabs (Clopidogrel bisulfate) .[name]... Take 1 tablet by mouth once a day    Nitrostat 0.4 Mg Subl (Nitroglycerin) ..... Use as directed  Problem # 4:  AAA (ICD-441.4) continue regular schedule follow withDr. Cari Caraway in [place], as scheduled.  Patient Instructions: 1)  Follow up with Dr. Orson Aloe tomorrow, June 3rd at 1:30pm as add-on appt. Take the copy of your chest x-ray report with you t this appt.  2)  Your physician wants you to follow-up in: 3 months. You will receive a reminder letter in the mail one-two months in advance. If you don't receive a letter, please call our office to schedule the follow-up appointment. Prescriptions: SIMVASTATIN 40 MG TABS (SIMVASTATIN) Take 1 tablet by mouth once a day  #90 x 3   Entered by:   Cyril Loosen, RN, BSN   Authorized by:   Nelida Meuse, PA-C   Signed by:   Cyril Loosen, RN, BSN on 01/05/2010   Method used:   Electronically to  [facility]  [address] [phone]* (retail)       [address]       [place], [place]  [address]       Ph: [phone] or [phone]       Fax: [phone]   RxID:   [id number] PLAVIX 75 MG TABS (CLOPIDOGREL BISULFATE) Take 1 tablet by mouth once a day  #90 x 3   Entered by:   Cyril Loosen, RN, BSN   Authorized by:   Nelida Meuse, PA-C   Signed by:   Cyril Loosen, RN, BSN on 01/05/2010   Method used:   Electronically to        [facility] [phone]* (retail)       [address]       [place], [place]  [address]       Ph: [phone] or [phone]       Fax: [phone]   RxID:   [id number]

## 2010-09-07 NOTE — Miscellaneous (Signed)
Summary: Rehab Report/ CARDIAC REHAB PROGRESS REPORT  Rehab Report/ CARDIAC REHAB PROGRESS REPORT   Imported By: Dorise Hiss 01/03/2010 08:59:21  _____________________________________________________________________  External Attachment:    Type:   Image     Comment:   External Document

## 2010-09-21 ENCOUNTER — Other Ambulatory Visit: Payer: Self-pay | Admitting: Vascular Surgery

## 2010-09-21 DIAGNOSIS — R978 Other abnormal tumor markers: Secondary | ICD-10-CM
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
## 2010-10-18 ENCOUNTER — Ambulatory Visit (INDEPENDENT_AMBULATORY_CARE_PROVIDER_SITE_OTHER): Payer: Medicare Other | Admitting: Vascular Surgery

## 2010-10-18 ENCOUNTER — Ambulatory Visit
Admission: RE | Admit: 2010-10-18 | Discharge: 2010-10-18 | Disposition: A | Discharge status: Home / Self Care | Payer: No Typology Code available for payment source | Source: Ambulatory Visit | Attending: Vascular Surgery | Admitting: Vascular Surgery

## 2010-10-18 DIAGNOSIS — R978 Other abnormal tumor markers: Secondary | ICD-10-CM

## 2010-10-18 DIAGNOSIS — I716 Thoracoabdominal aortic aneurysm, without rupture: Secondary | ICD-10-CM

## 2010-10-18 MED ORDER — IOHEXOL 300 MG/ML  SOLN
80.0000 mL | Freq: Once | INTRAMUSCULAR | Status: AC | PRN
  Administered 2010-10-18: 80 mL via INTRAVENOUS
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
## 2010-10-22 LAB — BASIC METABOLIC PANEL
BUN: 18 mg/dL (ref 6–23)
Calcium: 9.3 mg/dL (ref 8.4–10.5)
Creatinine, Ser: 1.42 mg/dL (ref 0.4–1.5)
GFR calc non Af Amer: 47 mL/min — ABNORMAL LOW (ref >60)

## 2010-10-22 LAB — CBC
Platelets: 208 10*3/uL (ref 150–400)
WBC: 9.9 10*3/uL (ref 4.0–10.5)

## 2010-10-23 LAB — CBC
HCT: 39.1 % (ref 39.0–52.0)
Hemoglobin: 13.2 g/dL (ref 13.0–17.0)
MCHC: 33.8 g/dL (ref 30.0–36.0)
MCHC: 34.3 g/dL (ref 30.0–36.0)
MCV: 93.3 fL (ref 78.0–100.0)
MCV: 92.4 fL (ref 78.0–100.0)
Platelets: 229 K/uL (ref 150–400)
RBC: 4.19 MIL/uL — ABNORMAL LOW (ref 4.22–5.81)
RBC: 3.69 MIL/uL — ABNORMAL LOW (ref 4.22–5.81)
RDW: 14.1 % (ref 11.5–15.5)
WBC: 10.5 K/uL (ref 4.0–10.5)

## 2010-10-23 LAB — DIFFERENTIAL
Basophils Absolute: 0 K/uL (ref 0.0–0.1)
Basophils Relative: 1 % (ref 0–1)
Eosinophils Absolute: 0.5 K/uL (ref 0.0–0.7)
Eosinophils Relative: 8 % — ABNORMAL HIGH (ref 0–5)
Lymphocytes Relative: 28 % (ref 12–46)
Lymphs Abs: 2 K/uL (ref 0.7–4.0)
Monocytes Absolute: 1.2 K/uL — ABNORMAL HIGH (ref 0.1–1.0)
Monocytes Relative: 16 % — ABNORMAL HIGH (ref 3–12)
Neutro Abs: 3.5 K/uL (ref 1.7–7.7)
Neutrophils Relative %: 48 % (ref 43–77)

## 2010-10-23 LAB — BASIC METABOLIC PANEL
CO2: 19 mEq/L (ref 19–32)
Chloride: 115 mEq/L — ABNORMAL HIGH (ref 96–112)
Chloride: 119 mEq/L — ABNORMAL HIGH (ref 96–112)
Creatinine, Ser: 1.41 mg/dL (ref 0.4–1.5)
Creatinine, Ser: 1.47 mg/dL (ref 0.4–1.5)
GFR calc Af Amer: 58 mL/min — ABNORMAL LOW (ref >60)
GFR calc Af Amer: 55 mL/min — ABNORMAL LOW (ref >60)
Potassium: 3.9 mEq/L (ref 3.5–5.1)
Sodium: 140 mEq/L (ref 135–145)

## 2010-10-23 LAB — POCT CARDIAC MARKERS
CKMB, poc: 2.7 ng/mL (ref 1.0–8.0)
Myoglobin, poc: 134 ng/mL (ref 12–200)
Troponin i, poc: <0.05 ng/mL (ref 0.00–0.09)

## 2010-10-29 LAB — COMPREHENSIVE METABOLIC PANEL
ALT: 19 U/L (ref 0–53)
AST: 26 U/L (ref 0–37)
Calcium: 8.7 mg/dL (ref 8.4–10.5)
Creatinine, Ser: 1.47 mg/dL (ref 0.4–1.5)
GFR calc Af Amer: 55 mL/min — ABNORMAL LOW (ref >60)
Sodium: 142 mEq/L (ref 135–145)
Total Protein: 5.9 g/dL — ABNORMAL LOW (ref 6.0–8.3)

## 2010-10-29 LAB — LIPID PANEL
Cholesterol: 150 mg/dL (ref 0–200)
HDL: 45 mg/dL (ref >39)
Total CHOL/HDL Ratio: 3.3 RATIO
VLDL: 12 mg/dL (ref 0–40)

## 2010-10-29 LAB — URINALYSIS, ROUTINE W REFLEX MICROSCOPIC
Glucose, UA: NEGATIVE mg/dL
Hgb urine dipstick: NEGATIVE
Leukocytes, UA: NEGATIVE
Protein, ur: 30 mg/dL — AB
pH: 7 (ref 5.0–8.0)

## 2010-10-29 LAB — BASIC METABOLIC PANEL
CO2: 21 mEq/L (ref 19–32)
Calcium: 8.2 mg/dL — ABNORMAL LOW (ref 8.4–10.5)
GFR calc Af Amer: 59 mL/min — ABNORMAL LOW (ref >60)
Glucose, Bld: 103 mg/dL — ABNORMAL HIGH (ref 70–99)
Potassium: 4 mEq/L (ref 3.5–5.1)
Sodium: 138 mEq/L (ref 135–145)

## 2010-10-29 LAB — DIFFERENTIAL
Eosinophils Absolute: 0.5 10*3/uL (ref 0.0–0.7)
Eosinophils Relative: 5 % (ref 0–5)
Lymphocytes Relative: 22 % (ref 12–46)
Lymphs Abs: 2.2 10*3/uL (ref 0.7–4.0)
Monocytes Relative: 13 % — ABNORMAL HIGH (ref 3–12)
Neutrophils Relative %: 59 % (ref 43–77)

## 2010-10-29 LAB — CK TOTAL AND CKMB (NOT AT ARMC)
CK, MB: 2.9 ng/mL (ref 0.3–4.0)
Relative Index: INVALID (ref 0.0–2.5)
Total CK: 41 U/L (ref 7–232)

## 2010-10-29 LAB — CBC
HCT: 33 % — ABNORMAL LOW (ref 39.0–52.0)
HCT: 34.8 % — ABNORMAL LOW (ref 39.0–52.0)
Hemoglobin: 11.4 g/dL — ABNORMAL LOW (ref 13.0–17.0)
Hemoglobin: 11.8 g/dL — ABNORMAL LOW (ref 13.0–17.0)
MCHC: 33.9 g/dL (ref 30.0–36.0)
MCHC: 34.5 g/dL (ref 30.0–36.0)
MCV: 95.1 fL (ref 78.0–100.0)
RBC: 3.5 MIL/uL — ABNORMAL LOW (ref 4.22–5.81)
RBC: 3.65 MIL/uL — ABNORMAL LOW (ref 4.22–5.81)
RDW: 14 % (ref 11.5–15.5)

## 2010-10-29 LAB — PROTIME-INR: Prothrombin Time: 13.8 seconds (ref 11.6–15.2)

## 2010-10-29 LAB — POCT CARDIAC MARKERS
CKMB, poc: 1.9 ng/mL (ref 1.0–8.0)
Troponin i, poc: <0.05 ng/mL (ref 0.00–0.09)
Troponin i, poc: <0.05 ng/mL (ref 0.00–0.09)

## 2010-10-29 LAB — CARDIAC PANEL(CRET KIN+CKTOT+MB+TROPI)
Relative Index: INVALID (ref 0.0–2.5)
Relative Index: INVALID (ref 0.0–2.5)
Total CK: 47 U/L (ref 7–232)
Troponin I: 0.04 ng/mL (ref 0.00–0.06)

## 2010-10-29 LAB — URINE MICROSCOPIC-ADD ON

## 2010-12-19 NOTE — Assessment & Plan Note (Signed)
OFFICE VISIT   KEYSTON, ARDOLINO  DOB:  Jan 02, 1920                                       07/01/2007  ZOXWR#:[id number]   I saw Mr. Wigington in the office today for continued follow-up of his  thoracoabdominal aneurysm.  I originally seen him in consultation in May  of this year when he was found to have a 4.9 cm infrarenal abdominal  aortic aneurysm.  The aneurysm was originally discovered a year prior to  this when he had a myocardial infarction.  A subsequent follow-up study  showed that the aneurysm had remained at 4.9 cm.  He was referred for  vascular consultation at that time.  On his CT scan, he was noted to  have significant ectasia of the suprarenal aorta.  I explained in May  that we generally would not consider elective repair unless the aneurysm  reached 5 1/5 cm in maximum diameter.  Certainly the fact that he has a  thoracoabdominal aneurysm significantly increases the risk of surgery  and we might consider waiting longer before considering this surgery.   Since I saw him last in May, he has had no significant abdominal or back  pain.  His only complaint has been pain in his hips from arthritis.  There has been no significant change in his medical history otherwise.   On review of systems; Cardiac:  He has had no recent chest pain, chest  pressure, palpitations or arrhythmias.  Pulmonary:  He has had no  bronchitis, asthma or wheezing.  Vascular:  He has had no claudication  or rest pain.   On physical examination, this is a pleasant 77 year old gentleman who  appears his stated age.  Blood pressure is 191/89, heart rate is 62.  HEENT:  There is no cervical lymphadenopathy.  I do no detect any  carotid bruits.  Lungs are clear bilaterally to auscultation.  On  cardiac exam, he has a regular rate and rhythm.  His abdomen is soft and  nontender.  He has normal pitch bowel sounds.  His aneurysm is palpable  and nontender.  He has palpable femoral  pulses and palpable pedal  pulses.  He has no significant lower extremity swelling.   He had a CT scan today and this shows that there has been no significant  change in the size of his aneurysm which by my measurement is 4.9 cm in  the infrarenal component.  The scan shows that the aneurysm at the level  of the diaphragm is 4.2 cm and I would clearly call this a  thoracoabdominal aneurysm for this reason.   If this aneurysm did enlarge significantly, I think he would require a  thoracoabdominal approach for a repair.  I have explained to him and his  family that typically Dr. Madilyn Fireman and Dr. Tyrone Sage perform the  thoracoabdominal aneurysm repairs and if the aneurysm did enlarge I  would get him over to see Dr. Madilyn Fireman to discuss this further.  Currently  however the aneurysm has remained stable in size for two follow-up  visits and given his age I think it is unlikely that this will ever have  to be addressed.  We will continue to follow it at 6 month intervals.   Di Kindle. [name], M.D.  Electronically Signed   CSD/MEDQ  D:  07/01/2007  T:  07/02/2007  Job:  [id number]   cc:   Delaney Meigs, M.D.

## 2010-12-19 NOTE — Assessment & Plan Note (Signed)
[facility]                          [facility] OFFICE NOTE   NAME:[name], [name] EGGERT                        MRN:          [id number]  DATE:03/04/2007                            DOB:          Mar 13, 1920    PRIMARY CARE PHYSICIAN:  Dr. Lysbeth Galas.   SUMMARY OF HISTORY:  [name] [name] is an 77 year old white male who was  recently hospitalized at [facility] from July 15 to February 20, 2007 after he  visited his primary care physician's office and was noted to be  hypotensive and bradycardia associated with weakness and nausea.  During  his hospitalization his Toprol and Mavik were discontinued with  improvement of his blood pressure and bradycardia and he was discharged  home.  Since discharge [name] [name] has continues to notice weakness,  particularly in the morning.  However, he does not let this weakness  interfere with his activities.  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  He has not had any dizziness with standing since discharge and  feels better in that regard.  He has been maintaining a blood pressure  and pulse diary since his discharge and all reviewed.  These have ranged  from a blood pressure low of 93/57 to a blood pressure high of 176/96,  his heart rate has ranged from 51 to 75.   PAST MEDICAL HISTORY:  NO KNOWN DRUG ALLERGIES.   CURRENT MEDICATIONS:  1. Aspirin 325 mg daily.  2. Multivitamin.  3. Plavix 75 daily.  4. Alphagan eye drops.  5. Azopt eye drops.  6. Fish oil.  7. Maudie Flakes.  8. Prostin.  9. Lipitor 40 mg nightly.  10.Advair 250/50.  11.Nitroglycerin 0.4 p.r.n.   He has not taken his Toprol and Mavik as instructed.   PAST MEDICAL HISTORY:  1. Non ST elevated myocardial infarction with balloon procedure of his      circumflex in April of 2007.  2. He has a history of atherosclerotic peripheral vascular disease.  3. Sinus bradycardia.  4. A long history  of weakness, dizziness, orthostatic hypertension,      bradycardia, hyperlipidemia, hypertension, remote tobacco use, 4.3-      cm abdominal aortic aneurysm.   Last EKG shows normal sinus rhythm at 61, normal axis, LVH with  repolarization changes, diffuse T wave inversion which remains unchanged  from EKG from May 2007.   PHYSICAL EXAMINATION:  GENERAL:  Well-nourished, well-developed,  pleasant white male in no apparent distress.  Blood pressure is 113/66,  heart rate is 63.  HEENT:  Unremarkable.  NECK:  Supple without thyromegaly, adenopathy, JVD or carotid bruits.  CHEST:  Symmetrical excursion, clear to auscultation.  HEART:  Regular rate and rhythm.  I do not appreciate any murmurs, rubs,  clicks or gallops.  ABDOMEN:  Soft and unremarkable.  EXTREMITIES:  Negative cyanosis, clubbing or edema.   IMPRESSION:  1. Recent hospital admission for hypotension and bradycardia which has      resolved by his home documentation and that he  has not had any      further similar symptoms.  2. Hypertension.  3. History as noted above.   DISPOSITION:  On review of [name] [name] blood pressure diary that he has  provided to [place] I have reviewed his medications with him and what they  are indicated for and the effects that they have.  I will resume Mavik a  1/2 a tablet p.o. daily to assist with his hypertension as this will not  affect his heart rate.  I have asked him to continue his blood pressure  diary and pulse monitoring.  I have asked him to further indicate pre  and post exercise blood pressure and heart rates to assure chronotropic  competence.  He states that he will do this and he does not have any  questions in regards to this.  We will see him back in approximately 1  month to review his blood pressure with the re-addition of his Mavik.  I  will continue to hold the Toprol in regards to his bradycardia.  At his  followup appointment if his heart rate shows adequate chronotropic   competence and blood pressure remains elevated hopefully we will be able  to increase his Mavik or add a lower dose beta blocker back to his  medical regimen.      Joellyn Rued, PA-C  Electronically Signed      Learta Codding, MD,FACC  Electronically Signed   EW/MedQ  DD: 03/04/2007  DT: 03/05/2007  Job #: [id number]   cc:   Delaney Meigs, M.D.
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
## 2010-12-19 NOTE — H&P (Signed)
NAMETAYTEN, [name] [name]                 ACCOUNT NO.:  [id number]   MEDICAL RECORD NO.:  [id number]          PATIENT TYPE:  EMS   LOCATION:  MAJO                         FACILITY:  [facility]   PHYSICIAN:  Arturo Morton. Riley Kill, MD, FACCDATE OF BIRTH:  11-02-1919   DATE OF ADMISSION:  02/18/2007  DATE OF DISCHARGE:                              HISTORY & PHYSICAL   PRIMARY CARE PHYSICIAN:  Dr. Joette Catching   PRIMARY CARDIOLOGIST:  Dr. Andee Lineman in our [place] office   CHIEF COMPLAINT:  Weakness and dizziness.   HISTORY OF PRESENT ILLNESS:  [name] [name] is a very pleasant, 77 year old  male patient with a history of coronary artery disease as outlined below  as well as infrarenal abdominal aortic aneurysm followed by Dr. [name],  who is transferred from his primary physician's office secondary to  hypotension and bradycardia.  The patient reports a several-month  history of sinusitis treated by Dr. Lysbeth Galas since February of 2008.  He  has apparently had increased nausea secondary to postnasal drip.  Over  the last one week, he has noted onset of weakness.  He describes near-  syncope and dizziness with standing.  He denies any syncope.  Denies  chest pain.  He does note dyspnea with over-exertion.  He did walk one  mile last night without problems.  Denies orthopnea, PND or pedal edema.  He usually feels better later in the day and is worse in the mornings.  His blood pressure was 80/40 in his primary care physician's office and  his pulse was 57.  He was apparently placed on IV fluids.  His blood  pressure is much better now at 156/78.   PAST MEDICAL HISTORY:  Significant for:  1. Coronary artery disease.        A:  Status post non-ST-elevation myocardial infarction April of  2007 treated with angioplasty to the fourth marginal branch of the  circumflex.        B:  Residual disease at catheterization April of 2007:  LAD 60%  proximal, 40% mid, obtuse marginal 1 40% ostial, RCA 50% proximal  followed  by 40% and a distal PDA lesion of 95% that supplied very little  myocardium.        C:  Preserved LV function, EF of 65% at cardiac catheterization  April of 2007.  1. Infrarenal abdominal aortic aneurysm.        A:  Measuring 4.9 cm by CT scan April of 2008.        B:  Followed by Dr. [name].  1. Peripheral arterial disease.  2. History of sinus bradycardia.  3. History of anemia.  4. Hyperlipidemia.  5. Hypertension.  6. Renal insufficiency.        A:  Creatinine 1.5 November 06, 2006.  1. History of pneumonia.  2. Bronchitis.  3. Rhinitis.  4. Macular degeneration.  5. Glaucoma.  6. History of amaurosis fugax.  7. Status post appendectomy.  8. Status post TURP.  9. Status post right knee arthroscopy.   MEDICATIONS:  1. Azopt suspension b.i.d.  2. Toprol XL 50 mg  1/2 tablet daily.  3. Lipitor 40 mg q.h.s.  4. Plavix 75 mg daily.  5. Mavik 2 mg 1/2 tablet daily.  6. Aspirin 325 mg daily.  7. Symbicort 80/4.5 mcg two puffs b.i.d.   ALLERGIES:  No known drug allergies, BUT CIPRO DID CAUSE HIM SIGNIFICANT  SIDE EFFECTS.   SOCIAL HISTORY:  He lives in [place] with his wife.  He has two  children.  He quit smoking in 1950's after 40-pack years.  He is  retired.  He is a World War II veteran and was in the [place] in  [place].   FAMILY HISTORY:  Significant for coronary artery disease.   REVIEW OF SYSTEMS:  Please see HPI.  Denies any fever, chills, no  hematochezia, hematuria, dysuria.  He does have nausea, but no vomiting  or diarrhea.  Does have cough from time to time, but no hemoptysis.  Denies any unilateral weakness, monocular blindness, difficulty with  speech or facial drooping.  He does note postnasal drip and sinus  congestion.  Rest of the review of systems are negative.   PHYSICAL EXAMINATION:  GENERAL:  He is a well-nourished, well-developed  elderly male in no acute distress.  VITAL SIGNS:  Blood pressure is 156/78, pulse 50, respirations 18,   oxygen saturation 100% on two liters, temperature is 96.7.  HEENT:  Normal.  NECK:  Without JVD, lymphadenopathy.  ENDOCRINE: Without thyromegaly.  CAROTIDS:  Without bruits bilaterally.  CARDIAC:  Normal S1-S2.  Regular rate and rhythm without murmurs.  LUNGS:  With decreased breath sounds bilaterally without wheezing,  rhonchi or rales.  ABDOMEN:  Soft, nontender with normoactive bowel sounds.  No  organomegaly, no rebound, no guarding.  EXTREMITIES:  Without clubbing, cyanosis or edema.  NEUROLOGIC:  He is alert and oriented x3.  Cranial nerves II-XII grossly  intact.  SKIN:  Warm and dry.   Chest x-ray is pending.   Laboratory data is pending.   EKG reveals sinus bradycardia with a heart rate of 48, dynamic T wave  inversions in 1, 2, 3, aVF and V2-6 - No significant change since  previous tracing dated November 18, 2005.  P-R interval is 200 msec.  There  is LVH voltage criteria.   IMPRESSION:  1. Symptomatic hypotension.        A:  Rule out dehydration.  1. Sinus bradycardia.        A:  Question symptomatic.  1. Coronary artery disease.        A:  Status post non-ST-elevation myocardial infarction April of  2007 treated with angioplasty to the circumflex.        B:  Residual coronary artery disease as outlined above.  1. Preserved left ventricular function.  2. Abdominal aortic aneurysm followed by Dr. [name] with CVTS.        A:  Measuring 4.9 cm by CT scan April of 2008.  1. Hypertension.  2. Hyperlipidemia.  3. Renal insufficiency.  4. Peripheral arterial disease.  5. Past history of amaurosis fugax.   PLAN:  The patient was also interviewed and examined by Dr. Shawnie Pons.  We plan to admit him for further evaluation and therapy.  We  will treat him for dehydration with IV fluids.  We will hold his Mavik  and Toprol.  We will watch him on telemetry and check serial cardiac  markers as well as lab work.  We will follow up on his renal function  and check  orthostatic vital signs.  He also had a chest x-ray.  Anticipate discharge within the next 24 to 48 hours.      [name] Newcomer, PA-C      Arturo Morton. Riley Kill, MD, [facility]  Electronically Signed    SW/MEDQ  D:  02/18/2007  T:  02/19/2007  Job:  [id number]   cc:   Delaney Meigs, M.D.

## 2010-12-19 NOTE — H&P (Signed)
[name] [name], [name] [name]                 ACCOUNT NO.:  [id number]   MEDICAL RECORD NO.:  [id number]          PATIENT TYPE:  INP   LOCATION:  NA                           FACILITY:  [facility]   PHYSICIAN:  Madlyn Frankel. Charlann Boxer, M.D.  DATE OF BIRTH:  Dec 12, 1919   DATE OF ADMISSION:  03/16/2008  DATE OF DISCHARGE:                              HISTORY & PHYSICAL   PROCEDURE:  Left total hip arthroplasty.   CHIEF COMPLAINT:  Left hip pain.   HISTORY OF PRESENT ILLNESS:  An 77 year old male with a history of left  hip pain secondary to osteoarthritis refractory to all conservative  treatment.  He has recently undergone right total hip replacement back  in April has done very well with this.  He was previously presurgically  assessed by his primary care physician, Dr. Lysbeth Galas.   PAST MEDICAL HISTORY:  1. Significant for osteoarthritis.  2. Dyslipidemia.  3. Peripheral vascular disease.  4. Hypertension.  5. Glaucoma.  6. Macular degeneration.  7. Impaired hearing with use of a hearing aid.  8. History of non-ST elevated myocardial infarction.   PAST SURGICAL HISTORY:  Right total hip replacement in the recent past  in April with no complications with anesthesia.   OTHER SURGICAL HISTORY:  1. Appendectomy.  2. Right arm nerve transfer.  3. Removal of bone chips in right arm.  4. Cataract surgery.  5. Injections into his left eye for macular degeneration.   FAMILY HISTORY:  Heart, leukemia, stroke.   SOCIAL HISTORY:  The patient is married.  Wife Mabel lives with him.  Went to [facility] for skilled nursing facility rehab.  Postoperatively did well there and plan on going there again after this  surgery.   ALLERGIES:  NO KNOWN DRUG ALLERGIES.   MEDICATIONS:  1. Alphagan eye drops one drop in each eye twice daily.  2. Econopred Plus one drop in both eyes daily.  3. Meclizine 25 mg one p.o. b.i.d.  4. Amlodipine 2.5 mg one p.o. daily.  5. Advair 250/50 b.i.d.   REVIEW OF  SYSTEMS:  NEUROLOGICAL:  He has some dizziness hearing loss.  He does wear dentures.  GI:  Some constipation issues.  GENITOURINARY:  Has increased urination at night.  MUSCULOSKELETAL:  Has joint pain.  Otherwise see HPI.   PHYSICAL EXAMINATION:  VITAL SIGNS:  Pulse 72, respirations 16, blood  pressure 132/70.  GENERAL:  Awake, alert and oriented, well-developed, well-nourished in  no acute distress.  NECK: Supple.  No carotid bruits.  CHEST/LUNGS:  Clear to auscultation bilaterally.  BREASTS:  Deferred.  HEART:  Regular rate and rhythm.  S1-S2 distinct.  ABDOMEN:  Soft, nontender, bowel sounds present.  GENITOURINARY:  Deferred.  EXTREMITIES:  Left hip has decreased range of motion increased pain.  SKIN:  No cellulitis.  NEUROLOGIC:  Intact distal sensibilities.   LABORATORY DATA:  EKG, chest x-ray all pending.  Presurgical testing.   IMPRESSION:  Left hip osteoarthritis.   PLAN OF ACTION:  Left total hip arthroplasty by surgeon Dr. Durene Romans  on March 16, 2008.  Risks and complications  were discussed.   No postoperative medications provided at time of history and physical.  He is a nursing facility rehab candidate for [facility] facility as he  went last time.     ______________________________  [name] [name] [name] [name], [place]      Madlyn Frankel. Charlann Boxer, M.D.  Electronically Signed    BLM/MEDQ  D:  03/15/2008  T:  03/15/2008  Job:  [id number]   cc:   [name] [name], M.D.  Fax: [phone]   [name] Codding, MD,FACC  [address]  [place], [place] [address]   [name] [name], M.D.  Fax: [phone]

## 2010-12-19 NOTE — Assessment & Plan Note (Signed)
[facility]                          [facility] OFFICE NOTE   NAME:[name] [name], [name] [name]                        MRN:          [id number]  DATE:10/16/2007                            DOB:          21-Aug-1919    REFERRING PHYSICIAN:  Delaney Meigs, M.D.   HISTORY OF PRESENT ILLNESS:  The patient is an 77 year old male with a  history of coronary artery disease status post non-ST elevation  myocardial infarction several years ago (April 2007).  The patient  underwent PCI of the circumflex coronary artery.  He has known  peripheral vascular disease including a sick thoracic abdominal aortic  aneurysm which is followed by Dr. [name].  He had a most recent  evaluation July 01, 2007.  The patient now reports that he has  severe hip pain which causes an inability to walk without a walker.  He  states that he is in constant pain and is planning to undergo hip  surgery.  He has had no recent evaluation for his coronary artery  disease.  As a matter of fact, the patient never had a stress test since  his cardiac catheterization in 2007.  The patient, however, denies any  chest pain or shortness of breath.  He has no orthopnea, PND, has no  palpitations or syncope.  He gave me a record of his blood pressure  readings, and they have been well controlled, paying careful attention  to modify his risk for thoracic abdominal artery rupture.   MEDICATIONS:  1. Aspirin 325 p.o. daily.  2. Alphagan.  3. Fish oil.  4. __________  5. Hydrochlorothiazide 25 mg 1/2 tab p.o. daily.  6. Norvasc 2.5 mg p.o. daily.  7. Darvocet b.i.d.   PHYSICAL EXAMINATION:  VITAL SIGNS:  Blood pressure 143/74, heart rate  64, weight 145 pounds.  NECK:  Normal carotid upstroke, no carotid bruits.  LUNGS:  Clear breath sounds bilaterally.  HEART:  Regular rate and rhythm.  Normal S1 and S2, no murmurs, rubs, or  gallops.  ABDOMEN:  Soft, nontender.  No rebound or guarding.  Good  bowel sounds.  EXTREMITIES:  No cyanosis, clubbing, or edema.   PROBLEM LIST:  1. Status post non-ST-elevation myocardial function, see details      above.  2. Presumed normal left ventricular function.  3. History of atherosclerotic peripheral vascular disease including a      thoracic abdominal aneurysm.  4. History of orthostasis, resolved.  5. History of hypertension, controlled.  6. Abnormal EKG with left ventricle hypertrophy and secondary      repolarization changes.  7. Dyslipidemia.  8. History of remote tobacco use.   PLAN:  1. The patient is scheduled to undergo hip surgery.  From a cardiac      perspective, if he has a negative Cardiolite stress study, he      should be at low risk.  I did ask him to discuss the risk of his      thoracic abdominal aneurysm in regard to rupture with Dr. [name].  2. From a cardiovascular perspective, however, the patient can  likely      be cleared for surgery, but we will plan on a Cardiolite study in      the morning.     Learta Codding, MD,FACC  Electronically Signed    GED/MedQ  DD: 10/16/2007  DT: 10/17/2007  Job #: [id number]   cc:   Madlyn Frankel Charlann Boxer, M.D.  Delaney Meigs, M.D.

## 2010-12-19 NOTE — Assessment & Plan Note (Signed)
OFFICE VISIT   [name] [name], [name] [name]  DOB:  1920/04/25                                       11/30/2008  HQION#:[id number]   I saw the patient in the office today for continued followup of his  abdominal aortic aneurysm.  I last saw him in October of 2009.  He does  have some chronic low back pain but this has not changed in character.  He has had no significant abdominal pain.  He has undergone bilateral  hip replacements and has done well from that standpoint.   REVIEW OF SYSTEMS:  He has had no recent chest pain, chest pressure,  palpitations or arrhythmias.  He has had no productive cough,  bronchitis, asthma or wheezing.   PHYSICAL EXAMINATION:  This is a pleasant 77 year old gentleman who  appears his stated age.  Blood pressure is 177/96, heart rate is 60.  Neck is supple.  I do not detect any carotid bruits.  Lungs are clear  bilaterally to auscultation.  On cardiac exam he has a regular rate and  rhythm.  His abdomen is soft and nontender.  His aneurysm is palpable  and nontender.  He has palpable femoral pulses and warm and well-  perfused feet without ischemic ulcers.  There is no evidence of  atheroembolic disease.   His CT scan of the abdomen and chest was performed today and as  interpreted by the radiologist it was felt that he had a stable  appearance of the lower thoracic aortic aneurysm and slight enlargement  of the abdominal aortic aneurysm which they measured at 5.4 cm today.  However, by my measurement the maximum diameter that I can obtain is 5.2  cm which has not changed significantly from my measurement compared to 6  months ago.  The maximum diameter of the distal thoracic aorta is 4.2  cm.  This too is stable.   I again explained that for a normal risk patient we generally would not  recommend elective repair of the aneurysm unless it reached 5.5 cm in  maximum diameter.  His is clearly less than this.  He is 77 years old  and I do  not think he is a good candidate for a thoracoabdominal repair  and therefore if the infrarenal component enlarged significantly we  could potentially consider addressing the infrarenal aneurysm.  He is  scheduled for a followup visit in 6 months and at that time we will  simply obtain an abdominal ultrasound and try to alternate with CTs as  long as the aneurysm remains stable in size.  Again, I think he would be  extremely high risk for thoracoabdominal exposures.   Di Kindle. [name], M.D.  Electronically Signed   CSD/MEDQ  D:  11/30/2008  T:  12/01/2008  Job:  [id number]

## 2010-12-19 NOTE — Discharge Summary (Signed)
NAMECELESTINO, ACKERMAN                 ACCOUNT NO.:  [id number]   MEDICAL RECORD NO.:  [id number]          PATIENT TYPE:  INP   LOCATION:  4735                         FACILITY:  [facility]   PHYSICIAN:  Arturo Morton. Riley Kill, MD, FACCDATE OF BIRTH:  12-Feb-1920   DATE OF ADMISSION:  02/18/2007  DATE OF DISCHARGE:  02/20/2007                               DISCHARGE SUMMARY   PROCEDURES:  Two-view chest x-ray.   PRIMARY DISCHARGE DIAGNOSIS:  Symptomatic bradycardia.   SECONDARY DIAGNOSES:  1. Non-ST segment elevation myocardial infarction in April 2007 with      percutaneous intervention to the circumflex OM-4.  Medical therapy      recommended for 60% LAD and __________ RCA, PDA distal 95% small      vessel not suitable for intervention.  2. Preserved left ventricular function with an EF of 65%.  3. Infrarenal abdominal aortic aneurysm at 4.9 cm followed by Dr.      Durwin Nora.  4. Anemia.  5. Hypertension.  6. Hyperlipidemia.  7. Chronic kidney disease stage III with a GFR of 37, BUN 27,      creatinine 1.75 this admission.  8. Remote history of pneumonia.  9. History of amaurosis fugax.  10.History of allergic rhinitis.  11.Glaucoma with macular degeneration.  12.History of rhinitis.  13.Intolerance to CIPRO with side effects.   Time at discharge 34 minutes.   HOSPITAL COURSE:  Mr. Rosiles is an 77 year old male with a history of  coronary artery disease.  He went to his family physician's office where  he was noted to be hypotensive and bradycardic.  He also had some nausea  and weakness.  He was admitted to the hospital for further evaluation.   Mr. Hartwell was found to be in sinus bradycardia with heart rate in the  50s.  He was also found to be hypotensive with an initial blood pressure  systolic in the 80s.  He was not critically orthostatic, but his blood  pressure did drop between 20 and 30 points when he went from sitting to  standing.  However, his pulse rate did not change at  all.   His cardiac enzymes were cycled and were negative for MI.  TSH and free  T4 were within normal limits at 2.732 and 1.08, respectively.  A chest x-  ray was also performed and showed emphysema but no acute cardiopulmonary  findings.   His blood pressure was variable between the 1 teens and the 140's during  his hospital stay.  His Toprol XL and Mavik were both on hold.  By February 20, 2007 his EKG was reviewed by Dr. Riley Kill, and it showed marked LVH  with early repolarization but no new changes.  He was feeling  significantly better and considered stable for discharge with outpatient  follow-up arranged.   DISCHARGE INSTRUCTIONS:  His activity level is to be increased  gradually.  He is to stick to a low fat diet.  He is to follow up with  Dr. Lysbeth Galas next week.  He is to follow up with Joellyn Rued, PA-C for  Dr.  DeGent on July 29 at 10 a.m.   DISCHARGE MEDICATIONS:  1. Toprol XL 50 mg 1/2 tab daily is discontinued.  2. Mavik 2 mg 1/2 tab daily is on hold.  3. Alphagan eye drops 2 drops OU b.i.d.  4. Lipitor 40 mg q.h.s.  5. Plavix 75 mg daily.  6. Buffered aspirin 325 mg daily.  7. Advair 250/50 b.i.d.      Theodore Demark, PA-C      Arturo Morton. Riley Kill, MD, [facility]  Electronically Signed    RB/MEDQ  D:  02/20/2007  T:  02/21/2007  Job:  [id number]   cc:   Delaney Meigs, M.D.

## 2010-12-19 NOTE — Discharge Summary (Signed)
NAMEBRYER, COZZOLINO                 ACCOUNT NO.:  [id number]   MEDICAL RECORD NO.:  [id number]          PATIENT TYPE:  INP   LOCATION:  1621                         FACILITY:  [facility]   PHYSICIAN:  Madlyn Frankel. Charlann Boxer, M.D.  DATE OF BIRTH:  [birth year]   DATE OF ADMISSION:  03/16/2008  DATE OF DISCHARGE:                               DISCHARGE SUMMARY   ADMITTING DIAGNOSES:  1. Osteoarthritis.  2. Dyslipidemia.  3. Peripheral vascular disease.  4. Hypertension.  5. Glaucoma.  6. Macular degeneration.  7. Impaired hearing with use of hearing aid.  8. History of non-ST elevated myocardial infarction.   DISCHARGE DIAGNOSES:  1. Osteoarthritis.  2. Dyslipidemia.  3. Peripheral vascular disease.  4. Hypertension.  5. Glaucoma.  6. Macular degeneration.  7. Impaired hearing with the use of a hearing aid.  8. History of non-ST elevation myocardial infarction.  9. Acute blood loss anemia with transfusion.   HISTORY OF PRESENT ILLNESS:  An 77 year old male with a history of left  hip pain secondary to osteoarthritis refractory to all conservative  treatment.  He has also recently undergone a right total hip replacement  back in April 2009, and has done very well with this.  He was  presurgically assessed by his primary care physician, Dr. Lysbeth Galas.   CONSULTS:  None.   PROCEDURE:  Left total hip arthroplasty by surgeon, Madlyn Frankel. Charlann Boxer,  M.D., assistant [name]. Mann, PA-C.   LABORATORY DATA:  CBC upon admission; hemoglobin 14.3, hematocrit 42.4,  platelets 248.  At the time of discharge; hemoglobin 11.5, hematocrit  32.6 and platelets 186.  Cardiac panel postoperatively was negative for  infarction, troponin was 0.02.  Metabolic panel preadmission; sodium  141, potassium 4, glucose 82, creatinine was 1.41, BUN was 21.  Charted  throughout his course of stay.  At the time of discharge, his sodium was  136, potassium 4.2, BUN 26, creatinine 1.64, glucose 118.  Coagulation  all within  normal limits, INR was 1.  Not on Coumadin.  UA was negative  for nitrites, it did show 30 protein in his urine.   RADIOLOGY:  Chest two-view showed emphysema without evidence of any  acute cardiopulmonary disease.   CARDIOLOGY:  EKG shows normal sinus rhythm.   HOSPITAL COURSE:  The patient underwent left total hip replacement,  tolerated the procedure well and was admitted to the orthopedic floor.  He made very good progress during his course of stay, but still required  further rehabilitation in order to be safe living independently.  He did  remain afebrile.  He had a low urinary output.  This subsided with a  little volume bolus.  Dressing was changed, there was no significant  ooze from the wound.  He was neurovascularly intact to his left lower  extremity.  He was weightbearing as tolerated.  He received 2 units of  blood on March 18, 2008.  He had a little bit of a fever, but it  resolved and his hemoglobin and hematocrit came back up with his final  hematocrit reading being 32.6.  His left hip  was dry, he was ready for  discharge.   DISCHARGE PHYSICAL THERAPY:  Weightbearing as tolerated with the use of  walker.   DISCHARGE DIET:  Regular as tolerated by patient.   DISCHARGE WOUND CARE:  Keep dry.   DISCHARGE MEDICATIONS:  1. Amlodipine 2.5 mg 1 p.o. q.a.m.  2. Meclizine 25 mg p.o. 1 p.o. q.a.m. and 1 p.o. q.p.m.  3. Advair 250/50 one q.a.m. and 1 q.p.m.  4. Pred Mild 0.12% opthalmic 1 drop both eyes once daily.  5. Alphagan 0.15% opthalmic 1 drop both eyes in the morning and in the      evening.  6. Lovenox 40 mg subcu q.24 x11 days.  7. Robaxin 500 mg 1 p.o. q.6 muscle spasm.  8. Iron 325 mg 1 p.o. t.i.d. x2 weeks.  9. Enteric-coated aspirin 325 mg 1 p.o. daily x4 weeks after Lovenox      completed.  10.Colace 100 mg p.o. b.i.d. p.r.n. constipation on narcotics.  11.Vicodin 5/325 one-to-two p.o. q.4-6 p.r.n. pain.  12.Tylenol 325 mg 1-2 p.o. q.4-6 p.r.n. pain and  substitute for      Vicodin.  Do not give Vicodin and Tylenol at the same time,      however, if the patient only requests Tylenol, give this instead of      Vicodin.  13.Enema of choice, laxative of choice.  He states that he uses some      kind of equip tablet he may self-administer if he has this.  14.MiraLax 70 gm p.o. daily p.r.n. constipation while on narcotics.   DISCHARGE FOLLOWUP:  Follow up with Dr. Charlann Boxer, phone [phone] in 2 weeks  for wound check.     ______________________________  [name]. Loreta Ave, [place]      Madlyn Frankel. Charlann Boxer, M.D.  Electronically Signed    BLM/MEDQ  D:  03/19/2008  T:  03/19/2008  Job:  [id number]

## 2010-12-19 NOTE — Discharge Summary (Signed)
[name] [name], [name] [name]                 ACCOUNT NO.:  [id number]   MEDICAL RECORD NO.:  [id number]          PATIENT TYPE:  INP   LOCATION:  1531                         FACILITY:  [facility]   PHYSICIAN:  Madlyn Frankel. Charlann Boxer, M.D.  DATE OF BIRTH:  03/27/1920   DATE OF ADMISSION:  12/01/2007  DATE OF DISCHARGE:  12/05/2007                               DISCHARGE SUMMARY   ADMITTING DIAGNOSES:  1. Osteoarthritis.  2. Dyslipidemia.  3. Peripheral vascular disease.  4. Hypertension.  5. Glaucoma.  6. Macular degeneration.  7. Impaired hearing with the use of a hearing aid.  8. History of non-ST elevated myocardial infarction.   DISCHARGE DIAGNOSES:  1. Osteoarthritis.  2. Dyslipidemia.  3. Peripheral vascular disease.  4. Hypertension.  5. Glaucoma.  6. Macular degeneration.  7. Impaired hearing with the use of a hearing aid.  8. History of non-ST elevated myocardial infarction.  9. Acute blood loss anemia.   HISTORY OF PRESENT ILLNESS:  An 77 year old male with a history of right  hip pain secondary to osteoarthritis.  He also has a significant history  of end stage left hip osteoarthritis as well.  He does use a rolling  walker to ambulate.  His right hip has been refractory to all  conservative treatment.  He was pre-surgically assessed and cleared for  a right total hip replacement.  He did undergo right total hip  replacement on December 01, 2007 by surgeon Dr. Durene Romans.   CONSULTATIONS:  None.   LABORATORY:  Preadmission CBC:  Hemoglobin hematocrit 12.4 and 36.3,  platelets 251, tracked throughout his course of stay.  Postoperatively  he did have a drop in his blood, had some anemia.  His crit dropped down  to 22.5.  It was re-measured.  It came back up to 30.9 and was stable.  C-MET checked preoperatively sodium 144, potassium 3.9, glucose 94, BUN  33, creatinine 1.75.  His GFR was 37.  Tracked throughout his course of  stay.  He maintained some chronic renal insufficiency with  creatinine  readings in the 1.7 range and at discharge his creatinine was 1.74.  Sodium 137, potassium 4.1, glucose 121.  GFR was still 37.  His calcium  was 8.1.  UA preoperative was negative.  Coags:  INR normal 1.   RADIOLOGY:  Portable pelvis satisfactory right hip replacement.  Chest,  2-view, emphysema, no acute cardiopulmonary findings.   CARDIOLOGY:  EKG showed sinus bradycardia, ST and marked T wave  abnormalities.  No significant changes since previous ECG.   HOSPITAL COURSE:  The patient underwent right total hip replacement and  tolerated the procedure well.  He was admitted to the orthopedic floor.  He remained hemodynamically stable his first day.  Dressing was changed.  We did pull his Hemovac.  He had a significant amount of drainage  through the portal at the time.  His throat was sore.  We gave him some  lozenges.  He was using some nasal cannula to keep his sats up.  He did  well.  He was weightbearing as tolerated.  He was  able to ambulate 50  feet on the first day.  The next day he did have some anemia.  Hematocrit dropped down to 22.5.  We transfused a couple of units.  We changed his dressing.  He had some  serosanguineous ooze but predominantly through the Hemovac portal.  He  was neurovascularly intact in his right lower extremity and continued to  do his weightbearing as tolerated.  Family discussed home versus SNF and  they requested [facility].  We saw him on  the 30th.  Rechecked his H&H and started him on some antibiotics just  for prophylactic coverage to prevent any infection.  He was placed on  Keflex 500 mg b.i.d. due to elevated creatinine.  Again, he continued to  do well with his ambulation.  We held his Lovenox.  When we saw him on the 1st, he was still having some issues with some  chronic shortness of breath.  No chest pain.  His H&H was 10.8 and 30.9.  Dressing was changed.  We covered his Hemovac portal with some  benzoin  around the edges and a Tegaderm.  His wound was okay.  He was  neurovascularly intact and still weightbearing as tolerated.  He was  ready to be discharged to a SNF facility for further rehab.   DISCHARGE DISPOSITION:  Discharged to skilled nursing facility rehab for  further strengthening and progress in independence in activities of  daily living.   DISCHARGE PHYSICAL THERAPY:  Weightbearing as tolerated with the use of  a rolling walker.  Of note, he does have a history of significant end  stage left hip osteoarthritis which may complicate some of his rehab  process.   DISCHARGE WOUND CARE:  Keep wound dry.  Over the Hemovac portal site  that has some Tegaderm on it, you can change this out, use benzoin  around the edges, recover with Tegaderm as needed.  The dressing just  replace dry gauze on a daily basis.  Keep dry.   DISCHARGE MEDICATIONS:  1. Lovenox 30 mg subcutaneous daily x10 days.  2. Aspirin 325 mg one tab once daily x4 weeks, after the Lovenox is      completed.  3. Robaxin 500 mg one tab every 8 hours as needed for muscle spasm,      pain.  4. Vicodin 5/325 one to two p.o. q.4-6 h. p.r.n. pain.  5. Iron 325 mg one p.o. t.i.d. x3 weeks.  6. Colace 100 mg one p.o. b.i.d. p.r.n. constipation.  7. MiraLax 17 G p.o. daily p.r.n. constipation.  8. Keflex 500 mg p.o. b.i.d. x3 days.   HOME MEDICATIONS:  1. Amlodipine 2.5 mg one p.o. every morning.  2. Hydrochlorothiazide 25 mg tab, half a tab daily.  3. Lactaid extra strength one daily.  4. Pred Mild 0.12%, Econopred, I guess it is.  One drop both eyes.      The patient may self administer.  5. Alphagan eye drop, the patient may self administer.  6. Advair 250/50 p.r.n.  7. Multivitamin daily.  8. Folbee folic acid daily.  9. Macular protection plus daily.  10.Meclizine 25 mg three times daily as needed.   DISCHARGE FOLLOWUP:  Follow up with Dr. Charlann Boxer or Sharlet Salina L. Marcellus Scott.  on Friday, May 8th for a  wound check, phone number [phone] and then  subsequently 2 weeks later with Dr. Charlann Boxer, phone number [phone].   DISCHARGE DIET:  Regular.   DISCHARGE SPECIAL INSTRUCTIONS:  Maintain  oxygen saturation at greater  than 85%.  The patient may require encouragement to utilize incentive  spirometer 5 minutes every hour.  As noted in chest x-ray, he does have  some emphysematous changes, therefore, just want to maintain sats above  85%.  If this should drop down, administer 2L nasal cannula p.r.n.     ______________________________  [name]. Loreta Ave, [place]      Madlyn Frankel. Charlann Boxer, M.D.  Electronically Signed    BLM/MEDQ  D:  12/05/2007  T:  12/05/2007  Job:  [id number]

## 2010-12-19 NOTE — H&P (Signed)
[name] [name], [name] [name]                 ACCOUNT NO.:  [id number]   MEDICAL RECORD NO.:  [id number]          PATIENT TYPE:  INP   LOCATION:                               FACILITY:  [facility]   PHYSICIAN:  Madlyn Frankel. Charlann Boxer, M.D.  DATE OF BIRTH:  11-Jan-1920   DATE OF ADMISSION:  12/01/2007  DATE OF DISCHARGE:                              HISTORY & PHYSICAL   PROCEDURE:  Right total hip arthroplasty.   CHIEF COMPLAINT:  Right hip pain.   HISTORY OF PRESENT ILLNESS:  An 77 year old male with a history of right  hip pain secondary to osteoarthritis.  He also has a history of left hip  arthritis as well.  He uses a walker to ambulate.  His right hip has  been refractory to all conservative treatment.  He has been presurgical  assessed, and cleared for right total hip replacement.   PAST MEDICAL HISTORY SIGNIFICANT FOR:  1. Osteoarthritis.  2. Dyslipidemia.  3. Peripheral vascular disease.  4. Hypertension.  5. Glaucoma.  6. Macular degeneration.  7. Impaired hearing with the use of a hearing aid.  8. History of non-ST elevated myocardial infarction.   PAST SURGICAL HISTORY SIGNIFICANT FOR:  1. Right knee surgery September 1944.  2. Appendectomy.  3. A right arm nerve transfer.  4. Removal of bone chips in right arm.  5. Cataract surgery.  6. Injections in the left eye for macular degeneration in 2007 and      2008.   FAMILY HISTORY:  Heart, leukemia, stroke.   SOCIAL HISTORY:  The patient is married.  His wife Mabel lives with him.  He will require SNIF placement.  He prefers [facility] in  [place], as that is where he lives.  He is also with his daughters today,  [name] and Erskine Squibb.  They will help with his care in the hospital.   DRUG ALLERGIES:  No known drug allergies.   MEDICATIONS:  1. Alphagan eye drops 1 drop 2x a day in each eye.  The patient will      bring medication with him to hospital.  2. Econopred 1% Plus 1 drop 2x a day in each eye.  The patient will  bring medicine with him to hospital.  May self-administered both      the Alphagan and the Econopred.  3. HCTZ 25 mg half tablet daily.  4. Norvasc 2.5 mg 1 tablet daily.  5. Darvocet 1 tablet 3x daily p.r.n.  6. Meclizine 25 mg 1 tablet b.i.d.  7. Advair 250/50 two times daily p.r.n.   REVIEW OF SYSTEMS:  HEENT/NEURO: He does have some dizziness.  He does  have hearing loss, wears hearing aid, and he does wear dentures.  GI:  Issues of constipation.  Genitourinary: He has increased urinating at  night.  Musculoskeletal:  He has joint pain and uses a rolling walker  for ambulation due to severe bilateral hip pain.  Otherwise see HPI.   PHYSICAL EXAMINATION:  VITAL SIGNS:  Pulse 64, respirations 18, blood  pressure 132/76.  GENERAL:  Awake, alert and oriented, well-developed, well-nourished.  He  does use a rolling trolley-type walker with a bench seat.  NECK: Supple.  No carotid bruits.  CHEST:  Lungs clear to auscultation bilaterally.  BREASTS:  Deferred.  HEART:  Regular rate and rhythm.  S1-S2 distinct.  ABDOMEN:  Soft, nontender, nondistended.  Bowel sounds are present.  GENITOURINARY:  Deferred.  EXTREMITIES:  Right hip has no internal range of motion.  He does have  increased pain with both internal-and-external range of motion.  SKIN:  No cellulitis.  NEUROLOGIC:  Intact distal sensibilities.  He is extremely hard of  hearing.   LABS, EKG, CHEST X-RAY:  Pending.   IMPRESSION:  Right hip osteoarthritis.   PLAN OF ACTION:  A right total hip arthroplasty at [facility]  on December 01, 2007 by surgeon Dr. Durene Romans.  I did discuss the fact  that he does have some persistent aggravating problems with his left hip  as well with severe end-stage disease of the left hip.  This may  complicate his rehab process, but we will do the best we can  under the circumstances.  It very well may be if he proceeds well  orthopedically and medically with this procedure, he may be  a candidate  for a left total hip replacement soon in your future, postoperatively,  in order to equalize his ability to walk without pain.  No postoperative  medications will be given as he is SNIF candidate.     ______________________________  [name]. [address]oreta [address], [place]      Madlyn Frankel. Charlann Boxer, M.D.  Electronically Signed    BLM/MEDQ  D:  11/19/2007  T:  11/19/2007  Job:  [id number]   cc:   Delaney Meigs, M.D.  Fax: [phone]   Learta Codding, MD,FACC  [address]  [place], [place] [address]   Donnie Coffin. Kathee Polite A. Rankin, M.D.  Fax: [phone]

## 2010-12-19 NOTE — Op Note (Signed)
NAMEKEONTA, ALSIP                 ACCOUNT NO.:  [id number]   MEDICAL RECORD NO.:  [id number]          PATIENT TYPE:  INP   LOCATION:  0010                         FACILITY:  [facility]   PHYSICIAN:  Madlyn Frankel. Charlann Boxer, M.D.  DATE OF BIRTH:  Feb 17, 1920   DATE OF PROCEDURE:  03/16/2008  DATE OF DISCHARGE:                               OPERATIVE REPORT   PREOPERATIVE DIAGNOSIS:  Left hip osteoarthritis.   POSTOPERATIVE DIAGNOSIS:  Left hip osteoarthritis.   PROCEDURE:  Left total hip replacement.   COMPONENTS USED:  DePuy hip system, size 54 Pinnacle cup, 2 cancellous  bone screws, 32 neutral Marathon line, size 7 high Trilock stem with  32+5 ball.   SURGEON:  Madlyn Frankel. Charlann Boxer, M.D.   ASSISTANT:  [name]. Loreta Ave, PA.   ANESTHESIA:  General anesthesia.   BLOOD LOSS:  500 mL.   DRAINS:  One.   COMPLICATIONS:  None.   INDICATIONS FOR PROCEDURE:  Mr. Winders is an 77 year old male patient of  mine from previous right total hip replacement, done very well with it.  On initial presentation he has had end-stage bilateral hip arthritis.  Given the response of his right hip, he was ready to proceed with his  left hip replacement.  Risks and benefits were reviewed.  Consent  obtained.   DESCRIPTION OF PROCEDURE:  Patient brought to the operating theater.  Once adequate anesthesia, preoperative antibiotics, Ancef administered,  patient is positioned in the right lateral decubitus position with left  side up, bony prominences were padded.  The hip was carefully positioned  on the right side.  Left lower extremity was prescrubbed and prepped and  draped in sterile fashion.  Lateral based incision was made for a  posterior approach of the hip.  The iliotibial band and gluteal fascia  were incised posteriorly.   The short external rotators were identified and taken down and separated  from the posterior capsule.  An L capsulotomy was made, preserving the  posterior leaflet.  This was saved to  protect the sciatic nerve from  retractors in addition to anastomosis repair at the end of the case.  Hip was dislocated and neck osteotomy made into the base of anatomic  landmark using 6-7 high offset stem with a 28 ball.  Attention was now  directed to preparation of the femur following starting drill.  I  irrigated the canal to prevent fat emboli, then began sizing with a size  2 broach and actually carried this all the way to a size 7, component of  6 on the other side, 7 set at the level of my neck cut.  At this point,  I packed off the femur, attended to the acetabulum.  Acetabular exposure  obtained, labrum removed.  I began reaming with a 46 reamer, then up to  a 53 reamer with good punctate bleeding bone.  A final 54 Pinnacle cup  was then impacted at approximately 35 degrees of abduction, 20 degrees  of forward flexion.  I placed 2 cancellous bone screws and then a  neutral trial liner.  Attention was  now directed back to the femur where  the broach was placed, 7 high offset neck was placed with a 32 neutral  1.5 ball.   Given the stability of this hip, all the trial components were removed.  The final hole eliminator was placed.  The final 32 neutral Marathon  liner impacted without difficulty.   The final 7 high Trilock stem was then impacted where the broach had  sat.  I retrialed and chose a 32 +5 ball because I felt that the leg  lengths appeared to be as equal as they did in the preoperative  position.   The final 32 +5 ball was opened and impacted onto a dry trunnion.  The  hip was irrigated throughout the case and again at this point.  There  was no significant bleeding that needed to be attended to.   I reapproximated the posterior capsule from the superior leaflet using  #1 Ethibond.  Medium Hemovac drain was placed in the deep tissues.  A #1  Vicryl was used to close the iliotibial band and the gluteal fascia.  The remainder of the wound was closed with 2-0  Vicryl and running 4-0  Monocryl.  The hip was cleaned, dried and dressed sterilely with Steri-  Strips and a sterile dressing.  He was brought to the recovery room  extubated, in stable condition, tolerating the procedure well.      Madlyn Frankel Charlann Boxer, M.D.  Electronically Signed     MDO/MEDQ  D:  03/16/2008  T:  03/16/2008  Job:  [id number]

## 2010-12-19 NOTE — Procedures (Signed)
DUPLEX ULTRASOUND OF ABDOMINAL AORTA   INDICATION:  Follow up abdominal aortic aneurysm.   HISTORY:  Diabetes:  No.  Cardiac:  Yes.  Hypertension:  Yes.  Smoking:  Quit about 35 years ago.  Family History:  Previous Surgery:   DUPLEX EXAM:         AP (cm)                   TRANSVERSE (cm)  Proximal             2.96 cm                   3.21 cm  Mid                  5.42 cm                   5.45 cm  Distal               4.53 cm                   4.39 cm  Right Iliac          1.60 cm                   1.58 cm  Left Iliac           1.22 cm                   1.44 cm   PREVIOUS:  Date:  AP:  5.4 per CT  TRANSVERSE:   IMPRESSION:  1. Abdominal aortic aneurysm noted with the largest measurement of      5.42 cm X 5.45 cm.  2. Study unchanged from previous.   ___________________________________________  Di Kindle. [name], M.D.   MG/MEDQ  D:  06/01/2009  T:  06/02/2009  Job:  [id number]

## 2010-12-19 NOTE — Op Note (Signed)
NAMESRINIVAS, [name] [name]                 ACCOUNT NO.:  [id number]   MEDICAL RECORD NO.:  [id number]          PATIENT TYPE:  INP   LOCATION:  0012                         FACILITY:  [facility]   PHYSICIAN:  Madlyn Frankel. Charlann Boxer, M.D.  DATE OF BIRTH:  24-Nov-1919   DATE OF PROCEDURE:  12/01/2007  DATE OF DISCHARGE:                               OPERATIVE REPORT   PREOPERATIVE DIAGNOSIS:  Right hip osteoarthritis.   POSTOPERATIVE DIAGNOSIS:  Right hip osteoarthritis.   PROCEDURE:  Right total hip replacement.   COMPONENTS USED:  DePuy hip system size 52 pinnacle cup 32 neutral  marathon liner, two cancellous bone screws, a 6 high Trilock stem with a  32 +5 ball.   SURGEON:  Madlyn Frankel. Charlann Boxer, M.D.   ASSISTANT:  Dwyane Luo, PA-C.   ANESTHESIA:  General.   BLOOD LOSS:  400.   DRAINS:  Drains x1.   COMPLICATIONS:  None.   INDICATIONS FOR PROCEDURE:  [name] [name] is a very pleasant 77 year old  gentleman presenting in the office for evaluation of bilateral hip pain.  Despite age of 66, he had significant discomfort and decrease in his  quality of life. Conservative measures failed to provide the relief he  was hoping for. He wished to discuss surgical intervention. Upon  clearance in his medical situation, the risks and benefits of infection,  DVT, component failure, dislocation, and need for revision surgery were  all reviewed.  Consent was obtained for the benefit of pain relief.   PROCEDURE IN DETAIL:  The patient was brought to the operative theater.  Once adequate anesthesia, preoperative antibiotics administered, Ancef,  the patient was positioned in the left lateral decubitus position with  the right side up. The right lower extremity was prescrubbed and prepped  and draped in a sterile fashion.  A lateral incision was made for a  posterior approach to the hip. The iliotibial band and gluteus fascia  were incised posteriorly. The short external rotators were identified  and taken down  separate from the posterior capsule.  An L capsulotomy  was made preserving the posterior capsule for later anatomic repair at  the end of the case as well as protection of the sciatic nerve from the  retractors.   The hip was dislocated and a neck osteotomy made based into the  trochanteric fossa.   Femoral preparation was carried out using a box osteotome to assure  laterality of the component.  I then did a starting drill followed by  hand reamer. I irrigated the canal to prevent fat emboli.   I began broaching with a size 1 broach and then broached up to a size 5.  At this point, I used a calcar planer to finish off the cut.   At this point, I packed off the femur and went into the acetabulum.  The  acetabular exposure was obtained placing retractors and labrectomy  carried out. At this point, I reamed down with a 43 reamer and then used  curved reamers up to 51 with excellent bony preparation.  The final 52  pinnacle cup was then impacted  with a good secure fit and augmented with  two cancellous screws.  I trialed with a 32 neutral liner. Trial  reduction was carried out first with size 5 stem in place. When I had  this in place, the hip stability was very good however there was some  shuck, so I tried with the 6 broach.  This sat at the level of the neck  cut where as the 5 had been subsided. With the 32.1 ball and then a 32.5  ball, I trialed. Leg lengths appeared to be equal with 1 mm shuck with  the 32 +5 ball.  At this point, all trial components were removed, the  central hole eliminator placed in the acetabular shell and the cup  reirrigated. I have impacted a 32 neutral liner without complication.  I  impacted the final 6 high Trilock to the level of the neck cut and based  on my trial reduction chose a 32 +5 ball that was impacted on a clean  and dry trunnion.  At this point, the hip was irrigated as it had been  throughout the case. I reapproximated the posterior  leaflet to this  superiorly in front of the capsule with a #1 Ethibond.  A medium Hemovac  drain was placed deep. I reapproximated the iliotibial band and the  gluteal fascia with a #1 Vicryl.  The remaining wound was closed with 2-  0 Vicryl and a running 4-0 Monocryl.  The hip was cleaned, dried, and  dressed sterilely with Steri-Strips and a sterile dressing.  He was  brought to the recovery room stable, tolerating the procedure well.      Madlyn Frankel Charlann Boxer, M.D.  Electronically Signed     MDO/MEDQ  D:  12/01/2007  T:  12/01/2007  Job:  [id number]

## 2010-12-19 NOTE — Assessment & Plan Note (Signed)
OFFICE VISIT   [name] [name], [name] [name]  DOB:  [birth year]                                       05/25/2008  UVOZD#:[id number]   I saw the patient in the office today for continued followup of his  aneurysm.  I last saw him on 07/01/2007 at which time the aneurysm  measured 4.9 cm in maximum diameter.  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  Since I saw him last he has had no  significant abdominal or back pain.  There has been no significant  change in his medical history except he has had both hips replaced and  has done well from that standpoint.  This was done by Dr. Charlann Boxer.   REVIEW OF SYSTEMS:  On review of systems he has had no recent chest  pain, chest pressure, palpitations or arrhythmias.  He has had no  bronchitis, asthma or wheezing.  His review of systems is otherwise  documented on the medical history form in his chart.  His medical issues  otherwise significant for hypertension and a previous myocardial  infarction in 2007.   SOCIAL HISTORY:  He is married and he has two children.  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  He quit smoking 35 years ago.   MEDICATIONS:  Documented on the medical history form in his chart.   PHYSICAL EXAMINATION:  Vital signs:  Blood pressure is 145/78, heart  rate is 60.  General:  This is a pleasant 77 year old gentleman who  appears his stated age.  Neck:  Supple.  There is no cervical  lymphadenopathy.  I do not detect any carotid bruits.  Lungs:  Clear  bilaterally to auscultation.  Cardiac:  He has a regular rate and  rhythm.  Abdomen:  Soft and nontender.  His aneurysm is palpable and  nontender.  He has palpable femoral pulses and warm well-perfused feet  without ischemic ulcers.  He has no evidence of atheroembolic disease.  He has no significant lower extremity swelling.  Neurologic:  Exam is  nonfocal.   I reviewed his CAT scan  and by my measurement the maximum diameter of  the infrarenal component of his aneurysm is 5.2 cm.  This has increased  slightly from 4.9 back in November of 2008.  Thus there has been only  slight enlargement over 1 year.  The suprarenal component has not  changed in size.   I have explained that we generally would not consider elective repair of  the aneurysm in a normal risk patient unless it reached 5.5 cm in  maximum diameter based on American Heart Association recommendations.  If the infrarenal component did continue to enlarge to significantly  greater than 5.5 cm I think he could be considered for elective repair  with the thought that we would ignore the suprarenal component.  I do  not think he is a candidate for a thoracoabdominal aneurysm repair given  his age.  However, it does appear that the aorta narrows down somewhat  at the level of the renals.  The patient has the neck of the infrarenal  aorta is 29 mm and the neck is fairly short so he is probably not a  candidate for an endovascular repair of his aneurysm.  I plan on seeing him back in 6 months with a followup CT of the abdomen  and pelvis.  He knows to call sooner if he has problems.   Di Kindle. [name], M.D.  Electronically Signed   CSD/MEDQ  D:  05/25/2008  T:  05/26/2008  Job:  [id number]   cc:   Delaney Meigs, M.D.

## 2010-12-19 NOTE — Assessment & Plan Note (Signed)
[facility]                          [facility] OFFICE NOTE   NAME:Solis, WHITLEY STRYCHARZ                        MRN:          [id number]  DATE:07/22/2008                            DOB:          1919-10-11    REFERRING PHYSICIAN:  Delaney Meigs, M.D.   HISTORY OF PRESENT ILLNESS:  The patient is an 77 year old male with a  history of coronary artery disease, status post non-ST-elevation  myocardial infarction several years ago.  The patient underwent PCI of  the circumflex coronary artery.  He also has known peripheral vascular  disease with a thoracic abdominal aortic aneurysm.  The latter is  followed by Dr. [name].  He just recently saw Dr. [name] and told him  that his aneurysm has enlarged could be seen back in 6 months.  The  patient underwent recent bilateral hip surgeries.  He has done quite  well from this.  He had no cardiac complications.  We cleared him  earlier this year after a normal Cardiolite imaging study.   The patient denies any substernal chest pain, shortness of breath,  orthopnea, or PND.  He states that his pain has completely resolved  after his bilateral hip replacement.  The patient brought in his log of  blood pressure readings and it appears that in the evening, he does have  hypertension, but for most part of the day his blood pressure is well  controlled.   MEDICATIONS:  1. Norvasc 2.5 mg p.o. day.  2. Alphagan eye drops.  3. Allergan eye drops.  4. Prostate vitamin daily.  5. Omega-3 fish oil 1200 mg daily.  6. Multivitamin.  7. MacularProtect suppository daily.  8. __________ daily.   PHYSICAL EXAMINATION:  VITAL SIGNS:  Blood pressure is 128/78, heart  rate 61, and weight 148.  HEENT:  Pupils; eyes are clear.  Conjunctivae clear.  NECK:  Supple.  Normal carotid upstroke.  No carotid bruits.  LUNGS:  Clear breath sounds bilaterally.  HEART:  Regular rate and rhythm.  Normal S1 and S2.  No murmur, rubs,  or  gallops.  ABDOMEN:  Soft and nontender.  No rebound or guarding.  Good bowel  sounds.  EXTREMITIES:  No cyanosis, clubbing, or edema.  NEUROLOGIC:  The patient is alert, oriented, and grossly nonfocal.   EKG normal sinus rhythm with LVH and secondary repolarization changes.   PROBLEM LIST:  1. History of coronary artery disease with non-ST elevation myocardial      infarction and stent to the circumflex coronary artery.  2. Normal left ventricular function.  3. Peripheral vascular disease with thoracic abdominal aneurysm      followed by Dr. [name].  4. Hypertension with borderline control.  5. Abnormal EKG with left ventricular hypertrophy and secondary      repolarization changes, unchanged.  6. Dyslipidemia.  7. History of remote tobacco use.   PLAN:  1. Interestingly, since his hip surgery the patient is not on aspirin      anymore, we will resume 81 mg of aspirin p.o. daily.  2. Review of the patient's blood pressure  readings reveals that his      blood pressure is poorly controlled in the evening and we will      double his Norvasc to 2.5 mg p.o. b.i.d.      Learta Codding, MD,FACC  Electronically Signed    GED/MedQ  DD: 07/22/2008  DT: 07/23/2008  Job #: [id number]   cc:   Delaney Meigs, M.D.

## 2010-12-22 NOTE — Cardiovascular Report (Signed)
NAMENORLAN, RANN                 ACCOUNT NO.:  [id number]   MEDICAL RECORD NO.:  [id number]          PATIENT TYPE:  INP   LOCATION:  2903                         FACILITY:  [facility]   PHYSICIAN:  Arturo Morton. Riley Kill, M.D. [facility] OF BIRTH:  January 31, 1920   DATE OF PROCEDURE:  11/16/2005  DATE OF DISCHARGE:                              CARDIAC CATHETERIZATION   INDICATIONS:  Mr. Gawron is a very delightful 77 year old gentleman who  presents with positive enzymes and ongoing chest pain.  Based on this, he  has also had T-wave inversion throughout inferolateral leads.  This could be  related to left ventricular hypertrophy with repolarization abnormality.  Nonetheless, with positive enzymes, it was felt a cardiac catheterization  was indicated.  This was performed earlier by Dr. Gala Romney. This  demonstrated total occlusion of the distal circumflex.  The patient  continued to have ongoing pain, and despite his age, it was felt that an  attempted percutaneous intervention was warranted.   PROCEDURE:  Percutaneous angioplasty of the fourth marginal branch.   DESCRIPTION OF PROCEDURE:  The patient was brought to the catheterization  laboratory. Using double-gloved technique, the 6-French sheath was exchanged  for a 7-French technique.  Heparin and Tegretol were given according to  protocol.  A JL-4 guiding catheter 6-French was utilized.  A 7-French sheath  had been utilized.  A 6-French guide was utilized. A Prowater wire was  placed down into the distal vessel, and we were able to successfully cross  into what appeared to be a fairly small caliber artery. A 1.5-mm Maverick  balloon was then taken down the lesion and dilated, establishing flow into  the distal vessel.  Verapamil and nitroglycerin were then administered  intracoronary wise.  Following this, dilatations were done with a 2-mm  Quantum Maverick and then 2.25-mm balloon.  The latter was a 12-mm length  2.5 Maverick. With this,  there was marked improvement in the appearance of  the artery with a stenosis less than 20%. The procedure was classified as  successful.  There were no complications.  All catheters were removed, and  the femoral sheath was sewn into place.   ANGIOGRAPHIC DATA:  The circumflex coronary artery was a fairly large-  caliber vessel.  There was a smaller caliber first marginal branch.  This  first marginal branch has mild ostial narrowing of 50 to 60%.  There is a  large second marginal branch which is free of critical disease.  The third  marginal branch is small in caliber.  The AV circumflex is totally occluded.  Following reperfusion, this vessel was opened up without significant  residual obstruction. There is less than 20% narrowing.  The distal vessel  was no more than a 2-mm artery.   CONCLUSION:  Successful percutaneous angioplasty of a small distal  circumflex fourth marginal branch in the setting of positive enzymes and  continued pain in an elderly gentleman.   DISPOSITION:  We got a nice angiographic result with a balloon alone. The  vessel was so small distally that I elected to not place a stent.  We  will  try to treat him medically at the present time.  He will need risk factor  reduction, and we will recommend that he try to take Plavix for a period of  time.  He will need follow-up in the office.      Arturo Morton. Riley Kill, M.D. [facility]  Electronically Signed     TDS/MEDQ  D:  11/16/2005  T:  11/16/2005  Job:  [id number]   cc:   Jonelle Sidle, M.D. [facility]  [address]  [place]  [place] [address]   Arvilla Meres, M.D. [facility]  [facility]  [address]  [place]  [place] [address]

## 2010-12-22 NOTE — Discharge Summary (Signed)
NAMEAVIYON, HOCEVAR                 ACCOUNT NO.:  [id number]   MEDICAL RECORD NO.:  [id number]          PATIENT TYPE:  INP   LOCATION:  3706                         FACILITY:  [facility]   PHYSICIAN:  Arvilla Meres, M.D. LHCDATE OF BIRTH:  24-Jan-1920   DATE OF ADMISSION:  11/15/2005  DATE OF DISCHARGE:  11/20/2003                                 DISCHARGE SUMMARY   TIME AT DISCHARGE:  37 minutes.   PROCEDURES:  1.  Cardiac catheterization.  2.  Coronary arteriogram.  3.  Left ventriculogram.  4.  Abdominal aortogram.  5.  PTCA to one vessel.   PRIMARY DIAGNOSIS:  Non-ST segment elevation myocardial infarction with a  peak CK MB of 1275/319.9 and a peak troponin of 35.4.   SECONDARY DIAGNOSES:  1.  Dyslipidemia with a total cholesterol of 149, triglyceride 113, HDL 39,      LDL 87.  Lipitor 40 added.  2.  Anemia, hemoglobin 11.5, hematocrit 33.5, minimal iron deficiency with      an iron level of 41 and TIBC of 237.  3.  Hypertension.  4.  Glaucoma.  5.  Macular degeneration.  6.  Hard of hearing.  7.  Family history of coronary artery disease.  8.  Status post appendectomy.  9.  TURP.  10. Right knee arthroscopy.   HOSPITAL COURSE:  [name] [name] is an 77 year old male with no known history of  coronary artery disease.  He had been feeling tired and complained of  increased fatigue and also stated that his blood pressure and heart rate had  been running low.  He was scheduled to see [facility] next week as  an outpatient but developed chest pain that radiated to his left neck.  He  was admitted for further evaluation and treatment.   Mr. Maino had recurrent chest pain and his cardiac enzymes elevated  indicating a non-ST segment elevation MI.  He was taken to the cath lab on  November 16, 2005.   The cardiac catheterization showed 60% LAD, circumflex was total distally  with clot and a 90% stenosis in the PDA.  There was other nonobstructive  disease in the RCA, OM1  and distal LAD.  An abdominal aortogram showed some  mild disease but probably okay.  His EF was 65% with 1+ MR.  After Dr.  Gala Romney and Dr. Riley Kill evaluated the films, they felt that percutaneous  intervention on the circumflex was the best option.  Dr. Riley Kill performed  PTCA to the circumflex reducing stenosis to less than 20% with TIMI-3 flow.   Mr.  Meeker was seen by Cardiac rehabilitation and did well.  He had a small  hematoma post sheath full, but there was no bruits.  He was started on a  beta blocker and this was titrated to Toprol XL 50 mg daily.  His heart rate  was remaining in the 50's, and he was asymptomatic with this.  His systolic  blood pressure on the Toprol XL and Mavik 2 mg daily, which he had taken  prior to admission but had been temporarily discontinued because  of fatigue  and low blood pressure was being tolerated, and the systolic blood pressure  was generally greater than 100.   By November 19, 2005, Mr.  Calandra had been ambulating with cardiac rehab  without chest pain or shortness of breath.  He was evaluated by Dr.  Gala Romney and considered stable for discharge without patient follow up  arranged.   DISCHARGE INSTRUCTIONS:  Activity level is being increased gradually.  He is  not to drive for a week and no lifting for three weeks.  He is to follow up  with Dr. Andee Lineman in the [facility] office on Wednesday, May 2 at 12:15.  He is to  follow up with Dr. Lysbeth Galas as needed.   DISCHARGE MEDICATIONS:  1.  Plavix 75 mg daily.  2.  Aspirin 325 mg daily.  3.  Nitroglycerin sublingual p.r.n.  4.  Toprol XL 50 mg daily.  5.  Mavik 2 mg daily.  6.  Lipitor 40 mg daily.  7.  Alphagan eye drops one drop OU b.i.d.  8.  Azopt eye drops one drop OU b.i.d.      Theodore Demark, P.A. [facility]      Arvilla Meres, M.D. [facility]  Electronically Signed    RB/MEDQ  D:  11/19/2005  T:  11/20/2005  Job:  [id number]   cc:   Delaney Meigs, M.D.  Fax: [phone]   [facility]   [place]

## 2010-12-22 NOTE — H&P (Signed)
NAMERUSTYN, CONERY                 ACCOUNT NO.:  [id number]   MEDICAL RECORD NO.:  [id number]          PATIENT TYPE:  EMS   LOCATION:  MAJO                         FACILITY:  [facility]   PHYSICIAN:  Audery Amel, MD     DATE OF BIRTH:  04/26/1920   DATE OF ADMISSION:  11/15/2005  DATE OF DISCHARGE:                                HISTORY & PHYSICAL   He is unassigned.  His primary care physician is Dr. Lysbeth Galas in [place].   CHIEF COMPLAINT:  Chest pain.   HISTORY OF PRESENT ILLNESS:  The patient is an 77 year old white male with  no prior cardiac history who presents to the ER for evaluation of chest  pain.  The patient states that the pain onset was approximately 7 p.m. this  evening while he was watching T.V.  He characterizes the pain as a severe,  cramping discomfort that radiates to the left neck.  There was no associated  nausea, vomiting, diaphoresis, shortness of breath or palpitations.  The  patient was transported via EMS to the emergency department for further  evaluation.  Chest pain resolved after two nitroglycerin sublingually.  Upon  my evaluation in the ED, the patient was chest painfree and has been since  arrival.  The patient denies any previous episodes similar to the one he had  this evening.  He is a fairly functional 77 year old stating that he is able  to play golf and could descend a flight of stairs with minimal difficulty.  He denies any orthopnea or PND or other symptoms of heart failure.   His initial EKG in the emergency department revealed normal sinus rhythm  with significant LVH by voltage criteria and T-wave inversion which may be  secondary to repolarization abnormality.  His initial biomarkers are  negative.   PAST MEDICAL HISTORY:  1.  Hypertension, uncontrolled.  2.  History of abnormal stress test in 1997.  3.  Glaucoma.  4.  Macular degeneration.  5.  Hearing loss with bilateral hearing aids.  6.  Status post appendectomy.  7.  Status post  transurethral resection of the prostate in 1970s.  8.  Status post right knee arthroscopy.   ALLERGIES:  No known drug allergies.   MEDICATIONS:  1.  Alphagan 1 drop OU b.i.d.  2.  Azopt 1 drop OU b.i.d.  3.  ICAPS 1 p.o. daily.  4.  Mavik 2 mg daily (the patient recently discontinued this medication).   SOCIAL HISTORY:  The patient lives in [place] with his wife.  He is  retired from the [employer].  The patient does have a 40-pack-year  smoking history, however, quit 32 years ago.  He denies any alcohol or  illicit substance use.   FAMILY HISTORY:  His mother died from a CVA and his father did have heart  problems; however, there is no general history of coronary artery disease.   REVIEW OF SYSTEMS:  CONSTITUTIONAL:  No fevers, chills, sweats or  adenopathy.  HEENT:  No headaches, sore throat, nasal discharge, photophobia  or vertigo.  SKIN:  No rashes or  lesions.  CARDIOPULMONARY:  Per HPI.  GU:  No frequency, urgency or dysuria.  NEUROPSYCHIATRIC:  No weakness, numbness  or mood disturbance.  MUSCULOSKELETAL:  No myalgias or arthralgias, joint  swelling or deformity.  GI:  No nausea, vomiting, diarrhea, bright red blood  per rectum, melena, hematemesis, dysphagia, or GERD.  ENDOCRINE:  No  polyuria, polydipsia, heat or cold intolerance.  All other systems are  negative.   PHYSICAL EXAMINATION:  VITAL SIGNS:  Blood pressure is 186/102, pulse is 72,  O2 saturation are 95% on two liters nasal cannula.  GENERAL:  The patient is alert and oriented x3, no acute distress,  pleasantly conversant.  HEENT:  Normocephalic and atraumatic.  EOMI.  PERRL.  Nares patent.  OP is  clear without erythema or exudate.  NECK:  Supple with full range of motion.  No JVD.  No audible bruit.  Lymphadenopathy none.  CARDIOVASCULAR:  S1 and S2 normal without murmurs, gallops, or rubs.  His  pulses are 2+ and symmetric bilaterally without bruits.  LUNGS:  Clear to auscultation bilaterally.   SKIN:  No rashes or lesions.  ABDOMEN:  Soft, nontender, and nondistended.  Positive bowel sounds.  No  hepatosplenomegaly.  GENITOURINARY:  Normal male genitalia at discharge.  RECTAL:  Stool heme negative.  EXTREMITIES:  No clubbing, cyanosis, or edema.  No rashes, no lesions, no  petechiae.  MUSCULOSKELETAL:  No joint deformity or fusions.  NEUROLOGICAL:  Alert and oriented x3.  Cranial nerves II through XII grossly  intact.  Strength 5/5 in bilateral extremities.  Normal sensation  throughout.   LABORATORY DATA:  Chest x-ray:  No acute cardiopulmonary process.  However,  there are changes consistent with COPD.   EKG:  Normal sinus rhythm with a rate of 69 and LVH by voltage criteria with  diffuse T-wave inversion.   Labs:  Sodium is 142, potassium 4.0, chloride 110, CO2 is 24, BUN is 23,  creatinine 1.3, glucose 136.  CK-MB is 2.2.  Troponin less than 0.05.   IMPRESSION:  1.  Chest pain, rule out myocardial infarction.  2.  Hypertension.   PLAN:  The patient will be admitted to telemetry bed for further evaluation.  Cycle cardiac biomarkers, serial EKGs to rule out myocardial infarction.  The patient did have a cardiac stress test in 1997 which was interpreted as  normal by his recall; however, no further evaluation since that time.  The  patient is quite hypertensive on presentation.  Systolic blood pressure of  186.  The patient states he was previously treated with [facility]; however, this  medication was discontinued due to hypotension.  We will plan to control his  blood pressure with hydralazine p.r.n. this evening and restart his Mavik.  The patient is somewhat resistant to beta blocking medications as he states  that he has had a  problem with slow heart rates in the past and is actually scheduled to be  evaluated by [facility] (Dr. Diona Browner) this month.  We will check a complete metabolic profile and fasting lipid panel with a.m. labs.  Plan for  a noninvasive  stress test versus cardiac catheterization to rule out  coronary artery disease.           ______________________________  Audery Amel, MD     SHG/MEDQ  D:  11/15/2005  T:  11/16/2005  Job:  [id number]

## 2010-12-22 NOTE — Assessment & Plan Note (Signed)
[facility]                          [facility] OFFICE NOTE   NAME:Meland, HUSSAIN MAIMONE                        MRN:          [id number]  DATE:07/04/2006                            DOB:          July 19, 1920    HISTORY OF PRESENT ILLNESS:  The patient is an 77 year old male with a  history of infrarenal abdominal aortic aneurysm followed by Dr. Samule Ohm.  The patient was seen in August in his office. He has an actual followup  appointment scheduled for December.  Apparently the aneurysm 4.3 cm has  been stable. The patient also has a history of coronary artery disease  and is status post non ST elevation myocardial infarction. However, the  patient has no chest pain or shortness of breath on exertion. 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 He denies  any palpitations or syncope. He states that he is feeling quite well.   MEDICATIONS:  1. Enteric coated aspirin 325 daily.  2. Toprol XL 50 mg a day.  3. Mavik was discontinued.  4. Lipitor 40 mg a day.  5. Multivitamin.  6. Plavix 75 mg a day.  7. Toprol XL 25 mg p.o. daily.  8. Fish oil, prostate.   PHYSICAL EXAMINATION:  VITAL SIGNS:  Blood pressure is 130/86. Heart  rate is 80 beats per minute.  GENERAL:  Well-nourished, white male in no apparent distress.  HEENT:  Pupils isocoric, conjunctivae clear.  NECK:  Supple, normal carotid upstrokes.  LUNGS:  Clear.  HEART:  Regular rate and rhythm. Normal S1, S2.  ABDOMEN:  Soft.  EXTREMITIES:  No cyanosis, clubbing or edema.   PROBLEM LIST:  1. Status post non ST elevation myocardial infarction.  2. Status post balloon angioplasty  3. Arteriosclerotic peripheral vascular disease.  4. History of sinus bradycardia stable.  5. Dyslipidemia.  6. History of hypertension.  7. Remote tobacco abuse.  8. Abdominal aortic aneurysm followed by Dr. Samule Ohm.   PLAN:  1. The patient is stable. Will refill his medication.  2. He  has a followup appointment with Dr. Samule Ohm who is closely      monitoring his abdominal      aortic aneurysm.  3. From a cardiac perspective, no real changes needed in his      medications and will followup with him in 6 months.     Learta Codding, MD,FACC  Electronically Signed    GED/MedQ  DD: 07/04/2006  DT: 07/05/2006  Job #: [id number]

## 2010-12-22 NOTE — Cardiovascular Report (Signed)
NAMEOSCAR, HANK                 ACCOUNT NO.:  [id number]   MEDICAL RECORD NO.:  [id number]          PATIENT TYPE:  INP   LOCATION:  2099                         FACILITY:  [facility]   PHYSICIAN:  Arvilla Meres, M.D. [facility] OF BIRTH:  06/18/1920   DATE OF PROCEDURE:  11/16/2005  DATE OF DISCHARGE:                              CARDIAC CATHETERIZATION   PRIMARY CARE PHYSICIAN:  Dr. Lysbeth Galas in [place]   CARDIOLOGY:  He is new to [facility].   PATIENT IDENTIFICATION:  Mr. Turman is a delightful 77 year old male with no  known history of coronary disease.  He does have a history of hypertension.  He had the acute onset of pain last night radiating to his jaw.  He was  brought to the emergency room.  Chest pain was relieved with nitroglycerin.  EKG showed LVH with deep T-wave inversions inferior and anterolaterally.  It  is unclear if these were old or new.  Subsequently, his cardiac enzymes  bumped up.  This morning he was having mild ongoing pain 1-2/10 and he was  brought to the catheterization laboratory.   PROCEDURES PERFORMED:  1.  Selective coronary angiography.  2.  Left heart catheterization.  3.  Left ventriculogram.  4.  Abdominal aortogram.   DESCRIPTION OF PROCEDURE:  The risks and benefits of catheterization were  explained.  Consent was signed and placed on the chart.  A 6-French arterial  sheath was placed in the right femoral artery using a modified Seldinger  technique.  Standard catheters including preformed Judkins JL-4, JR-4, and  angled pigtail were used in the procedure.  All catheter exchanges were made  over a wire.  There are no apparent complications.  Of note, we did have a  mild difficulty passing the wire from the right common iliac into the  central aorta, and once this was done we performed all catheter exchanges  over a high wire approach.   Central aortic pressure was 163/87 with a mean of 121.  LV pressure was  152/10 with an LVEDP of 25.   There was no aortic stenosis.   Left main was normal.   LAD was a long vessel giving off a large diagonal.  There was a 60% proximal  lesion before the diagonal and a 40% lesion in the midsection after the  diagonal.   The left circumflex was a large vessel that gave off a small OM-1, a large  OM-2.  The left circumflex was totally occluded in the mid AV groove.  There  was evidence of thrombus.  In the ostium of the small OM-1 there is a 40%  tubular lesion.   Right coronary was a large dominant vessel, gave off a large PDA, a large  first PL, and a small second PL.  There was a 50% proximal stenosis followed  by a 40% stenosis.  In the very distal PDA there was a 95% focal stenosis  which fed very little myocardium.   Left ventriculogram done in the RAO approach showed an EF of 65%.  There was  no obvious wall motion abnormalities, question  of 1+ MR.   Abdominal aortogram:  The left renal was well-visualized and was free of  significant stenosis.  The right renal artery was not well visualized but  seemed to be free of critical stenosis.  There was mild to moderate  abdominal aortic iliac disease.  There was mild to moderate disease in the  ostium of the right iliac which was slightly hazy, but this was not well  visualized.  There did not appear to be a high-grade stenosis.   ASSESSMENT:  1.  Three-vessel coronary disease with acute occlusion of the left      circumflex.  2.  Normal left ventricular function.  3.  Mild peripheral arterial disease.   PLAN/DISCUSSION:  I have reviewed the films with Dr. Riley Kill.  We will plan  for percutaneous intervention on the left circumflex today.      Arvilla Meres, M.D. [facility]  Electronically Signed     DB/MEDQ  D:  11/16/2005  T:  11/16/2005  Job:  [id number]

## 2011-01-10 ENCOUNTER — Ambulatory Visit: Payer: Medicare Other | Admitting: Cardiology

## 2011-01-19 ENCOUNTER — Other Ambulatory Visit: Payer: Self-pay | Admitting: *Deleted

## 2011-01-19 MED ORDER — CLOPIDOGREL BISULFATE 75 MG PO TABS: 75.0000 mg | ORAL_TABLET | Freq: Every day | ORAL | Status: DC

## 2011-02-20 ENCOUNTER — Encounter: Payer: Self-pay | Admitting: Cardiology

## 2011-02-20 ENCOUNTER — Telehealth: Payer: Self-pay | Admitting: Cardiology

## 2011-02-20 NOTE — Telephone Encounter (Signed)
See note below

## 2011-02-20 NOTE — Telephone Encounter (Signed)
Message copied by Murriel Hopper on Tue Feb 20, 2011  3:04 PM ------      Message from: Anette Guarneri      Created: Tue Feb 20, 2011 10:40 AM      Regarding: surgical clearence       Dr. Cathey Endow from [facility] called and pt needs to have eye surgery asap and he needs to know if pt can come off his plavix and/or ASA and how long can he be off. Dr. Cathey Endow will schedule surgery after he hears back from the office you can reach Dr. Cathey Endow on his cell number [phone]

## 2011-02-20 NOTE — Telephone Encounter (Signed)
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  Does patient need to discontinue his Plavix and ASA before surgery? Surgery has not been scheduled yet.

## 2011-02-20 NOTE — Telephone Encounter (Signed)
Dr. Arnell Asal notified of below.  Will fax to:  [phone].  Ophthalmology Dept.

## 2011-03-08 ENCOUNTER — Encounter: Payer: Self-pay | Admitting: Cardiology

## 2011-03-28 ENCOUNTER — Ambulatory Visit (INDEPENDENT_AMBULATORY_CARE_PROVIDER_SITE_OTHER): Payer: Medicare Other | Admitting: Cardiology

## 2011-03-28 ENCOUNTER — Encounter: Payer: Self-pay | Admitting: Cardiology

## 2011-03-28 VITALS — BP 121/74 | HR 54 | Ht 67.0 in | Wt 134.0 lb

## 2011-03-28 DIAGNOSIS — I4891 Unspecified atrial fibrillation: Secondary | ICD-10-CM

## 2011-03-28 DIAGNOSIS — I739 Peripheral vascular disease, unspecified: Secondary | ICD-10-CM

## 2011-03-28 DIAGNOSIS — I2510 Atherosclerotic heart disease of native coronary artery without angina pectoris: Secondary | ICD-10-CM

## 2011-03-28 DIAGNOSIS — E785 Hyperlipidemia, unspecified: Secondary | ICD-10-CM

## 2011-03-28 DIAGNOSIS — I10 Essential (primary) hypertension: Secondary | ICD-10-CM

## 2011-03-28 DIAGNOSIS — I714 Abdominal aortic aneurysm, without rupture, unspecified: Secondary | ICD-10-CM

## 2011-03-28 DIAGNOSIS — R9431 Abnormal electrocardiogram [ECG] [EKG]: Secondary | ICD-10-CM

## 2011-03-28 MED ORDER — CLOPIDOGREL BISULFATE 75 MG PO TABS: 75.0000 mg | ORAL_TABLET | Freq: Every day | ORAL | Status: AC

## 2011-03-28 MED ORDER — AMLODIPINE BESYLATE 5 MG PO TABS: 2.5000 mg | ORAL_TABLET | Freq: Two times a day (BID) | ORAL | Status: DC

## 2011-03-28 NOTE — Patient Instructions (Signed)
Continue all current medications. Your physician wants you to follow up in: 6 months.  You will receive a reminder letter in the mail one-two months in advance.  If you don't receive a letter, please call our office to schedule the follow up appointment   

## 2011-03-29 ENCOUNTER — Encounter: Payer: Self-pay | Admitting: Cardiology

## 2011-03-29 DIAGNOSIS — I739 Peripheral vascular disease, unspecified: Secondary | ICD-10-CM | POA: Insufficient documentation

## 2011-03-29 DIAGNOSIS — R9431 Abnormal electrocardiogram [ECG] [EKG]: Secondary | ICD-10-CM | POA: Insufficient documentation

## 2011-03-29 DIAGNOSIS — I714 Abdominal aortic aneurysm, without rupture: Secondary | ICD-10-CM | POA: Insufficient documentation

## 2011-03-29 NOTE — Assessment & Plan Note (Signed)
Abdominal aortic aneurysm: I performed a limited bedside echocardiogram and aneurysm appear to be further increased in size it now measures 6 cm with a large area of mural thrombus. The patient has an appointment with vascular surgery coming up and hopefully is a candidate for stent placement.

## 2011-03-29 NOTE — Assessment & Plan Note (Signed)
Blood pressure is well controlled 

## 2011-03-29 NOTE — Progress Notes (Signed)
HPI The patient is an 77 year old male with a history of coronary artery disease, status post non-ST elevation myocardial infarction with bare-metal stenting of the distal circumflex March 2011. The patient has nonobstructive LAD and RCA disease. He has peripheral vascular disease with a large abdominal aortic aneurysm which reportedly has further increased in size. He also has an abnormal EKG with large symmetric T wave inversions in anterolateral leads and inferior leads which has remained unchanged since March of 2011.  The patient has been doing well. He denies any chest pain shortness of breath orthopnea PND he has no palpitations or syncope. From a cardiovascular perspective he remains stable.  Allergies  Allergen Reactions  . Ciprofloxacin     REACTION: swelling of joints,mouth    Current Outpatient Prescriptions on File Prior to Visit  Medication Sig Dispense Refill  . aspirin 81 MG EC tablet Take 81 mg by mouth daily.        . Folic Acid-Vit B6-Vit B12 (FOLBEE) 2.5-25-1 MG TABS Take 1 tablet by mouth daily.        [name] levothyroxine (SYNTHROID, LEVOTHROID) 50 MCG tablet Take 50 mcg by mouth daily.        [name] loratadine (CLARITIN) 10 MG tablet Take 10 mg by mouth daily.        . Multiple Vitamins-Minerals (MACUVITE EYE CARE) TABS Take 1 tablet by mouth 2 (two) times daily.        . nitroGLYCERIN (NITROSTAT) 0.4 MG SL tablet Place 0.4 mg under the tongue every 5 (five) minutes as needed.        . Omega-3 Fatty Acids (FISH OIL) 1200 MG CAPS Take 1 capsule by mouth daily.        . prednisoLONE acetate (PRED MILD) 0.12 % ophthalmic suspension as directed.        [name] Specialty Vitamins Products (PROSTATE) TABS Take 1 tablet by mouth daily.         Past Medical History  Diagnosis Date  . HLD (hyperlipidemia)   . Abnormal EKG     left ventricular hypertrophy and secondary repolarization changes   . HTN (hypertension)     boarderline control   . Thoracic aortic aneurysm   . CAD in native  artery     with NSTEMI and stent to circumflex coronary artery 3/11. nml left ventricular function   . PVD (peripheral vascular disease)     with thoracic abd aneurysm; follwed by Dr. Earley Brooke. signif. enlarged measuring 6 cm  . Dyslipidemia   . Abdominal aortic aneurysm     Followed by vascular surgery    Past Surgical History  Procedure Date  . Total hip arthroplasty   . Appendectomy   . Cataract extraction   . Right arm nerve transfer   . Removal of bone chip     right arm     Family History  Problem Relation Age of Onset  . Cancer      fhx  . Stroke      fhx  . Coronary artery disease      fhx     History   Social History  . Marital Status: Married    Spouse Name: N/A    Number of Children: N/A  . Years of Education: N/A   Occupational History  . Not on file.   Social History Main Topics  . Smoking status: Former Smoker -- 1.0 packs/day for 35 years    Types: Cigarettes    Quit date: 08/06/1948  . Smokeless  tobacco: Never Used   Comment: qui tin 1950s   . Alcohol Use: Not on file  . Drug Use: Not on file  . Sexually Active: Not on file   Other Topics Concern  . Not on file   Social History Narrative   Married, retired [employer] vet   AVW:[id number] positives as outlined above. The remainder of the 18  point review of systems is negative  PHYSICAL EXAM BP 121/74  Pulse 54  Ht 5\' 7"  (1.702 m)  Wt 134 lb (60.782 kg)  BMI 20.99 kg/m2  General: Well-developed, well-nourished in no distress Head: Normocephalic and atraumatic Eyes: Status post left eye extraction secondary to trauma and infection, conjunctiva and lids normal Ears: No deformity or lesions Mouth:normal dentition, normal posterior pharynx Neck: Supple, no JVD.  No masses, thyromegaly or abnormal cervical nodes Lungs: Normal breath sounds bilaterally without wheezing.  Normal percussion Cardiac: regular rate and rhythm with normal S1 and S2, no S3 or S4.  PMI is normal.  No pathological  murmurs Abdomen: Normal bowel sounds, abdomen is soft and nontender without masses, organomegaly or hernias noted.  No hepatosplenomegaly, pulsatile mass noted in the abdomen MSK: Back normal, normal gait muscle strength and tone normal Vascular: Pulse is normal in all 4 extremities Extremities: No peripheral pitting edema Neurologic: Alert and oriented x 3 Skin: Intact without lesions or rashes Lymphatics: No significant adenopathy Psychologic: Normal affect   ECG: Normal sinus rhythm left ventricular hypertrophy with secondary repolarization changes and marked T wave inversions findings are chronic  ASSESSMENT AND PLAN

## 2011-03-29 NOTE — Assessment & Plan Note (Signed)
Coronary artery disease: Stable no recurrent chest pain. Baseline abnormal electrocardiogram secondary to LVH with strain. No significant LAD disease by cardiac catheterization March 2001. Continue medical therapy.

## 2011-03-29 NOTE — Assessment & Plan Note (Signed)
Continue risk factor modification 

## 2011-04-25 ENCOUNTER — Ambulatory Visit: Payer: Medicare Other | Admitting: Vascular Surgery

## 2011-05-01 ENCOUNTER — Encounter: Payer: Self-pay | Admitting: Vascular Surgery

## 2011-05-01 LAB — TYPE AND SCREEN
ABO/RH(D): O POS
Antibody Screen: NEGATIVE

## 2011-05-01 LAB — BASIC METABOLIC PANEL
BUN: 28 — ABNORMAL HIGH
BUN: 33 — ABNORMAL HIGH
CO2: 25
Calcium: 8.1 — ABNORMAL LOW
Calcium: 8.3 — ABNORMAL LOW
Calcium: 9.6
Creatinine, Ser: 1.72 — ABNORMAL HIGH
GFR calc Af Amer: 45 — ABNORMAL LOW
GFR calc non Af Amer: 37 — ABNORMAL LOW
GFR calc non Af Amer: 37 — ABNORMAL LOW
GFR calc non Af Amer: 38 — ABNORMAL LOW
Glucose, Bld: 126 — ABNORMAL HIGH
Potassium: 3.9
Potassium: 4.1
Sodium: 137
Sodium: 137
Sodium: 144

## 2011-05-01 LAB — CBC
HCT: 22.5 — ABNORMAL LOW
HCT: 36.3 — ABNORMAL LOW
Hemoglobin: 12.4 — ABNORMAL LOW
Hemoglobin: 7.8 — CL
MCHC: 35.1
Platelets: 161
Platelets: 201
Platelets: 251
RDW: 13.3
RDW: 13.7
WBC: 14 — ABNORMAL HIGH
WBC: 9.4

## 2011-05-01 LAB — HEMOGLOBIN AND HEMATOCRIT, BLOOD: HCT: 30.9 — ABNORMAL LOW

## 2011-05-01 LAB — PROTIME-INR: Prothrombin Time: 13.3

## 2011-05-01 LAB — URINALYSIS, ROUTINE W REFLEX MICROSCOPIC
Glucose, UA: NEGATIVE
Hgb urine dipstick: NEGATIVE
Protein, ur: 30 — AB
Specific Gravity, Urine: 1.025

## 2011-05-01 LAB — URINE MICROSCOPIC-ADD ON

## 2011-05-01 LAB — DIFFERENTIAL
Eosinophils Relative: 2
Lymphocytes Relative: 17
Lymphs Abs: 1.6
Neutro Abs: 6.4

## 2011-05-01 LAB — APTT: aPTT: 26

## 2011-05-02 ENCOUNTER — Encounter (INDEPENDENT_AMBULATORY_CARE_PROVIDER_SITE_OTHER): Payer: Medicare Other | Admitting: *Deleted

## 2011-05-02 ENCOUNTER — Encounter: Payer: Self-pay | Admitting: Vascular Surgery

## 2011-05-02 ENCOUNTER — Ambulatory Visit (INDEPENDENT_AMBULATORY_CARE_PROVIDER_SITE_OTHER): Payer: Medicare Other | Admitting: Vascular Surgery

## 2011-05-02 VITALS — BP 149/78 | HR 55 | Resp 16 | Ht 67.0 in | Wt 135.0 lb

## 2011-05-02 DIAGNOSIS — I714 Abdominal aortic aneurysm, without rupture: Secondary | ICD-10-CM

## 2011-05-02 NOTE — Progress Notes (Signed)
CC: followup thoracoabdominal aneurysm  HPI: [name] [name] is a 77 y.o. male who I have been following with a thoracoabdominal aneurysm. I last saw him in March of 2012. At that time by CT scan the maximum diameter of the infrarenal component was 5.9 cm. The descending thoracic aorta measures 4.2 cm in maximum diameter. Since I saw him last he said no abdominal pain or back pain. He has a history of hypertension and hyperlipidemia both of which are stable on his current medications. Also has a history of coronary artery disease but has had no recent cardiac problems.  Past Medical History  Diagnosis Date  . HLD (hyperlipidemia)   . Abnormal EKG     left ventricular hypertrophy and secondary repolarization changes   . HTN (hypertension)     boarderline control   . Thoracic aortic aneurysm   . CAD in native artery     with NSTEMI and stent to circumflex coronary artery 3/11. nml left ventricular function   . PVD (peripheral vascular disease)     with thoracic abd aneurysm; follwed by Dr. Earley Brooke. signif. enlarged measuring 6 cm  . Dyslipidemia   . Abdominal aortic aneurysm     Followed by vascular surgery  . Myocardial infarction 2007  . Macular degeneration     FAMILY HISTORY: Family History  Problem Relation Age of Onset  . Cancer      fhx  . Stroke      fhx  . Coronary artery disease      fhx   . Heart disease Mother     angina  . Cancer Father     leukemia  . Heart disease Father     SOCIAL HISTORY: History  Substance Use Topics  . Smoking status: Former Smoker -- 1.0 packs/day for 35 years    Types: Cigarettes    Quit date: 08/07/1967  . Smokeless tobacco: Never Used  . Alcohol Use: No    Allergies  Allergen Reactions  . Ciprofloxacin     REACTION: swelling of joints,mouth    Current Outpatient Prescriptions  Medication Sig Dispense Refill  . amLODipine (NORVASC) 5 MG tablet Take 0.5 tablets (2.5 mg total) by mouth 2 (two) times daily.  90 tablet  3  .  aspirin 81 MG EC tablet Take 81 mg by mouth daily.        [name] atorvastatin (LIPITOR) 20 MG tablet Take 20 mg by mouth daily.        . clopidogrel (PLAVIX) 75 MG tablet Take 1 tablet (75 mg total) by mouth daily.  90 tablet  3  . Folic Acid-Vit B6-Vit B12 (FOLBEE) 2.5-25-1 MG TABS Take 1 tablet by mouth daily.        [name] levothyroxine (SYNTHROID, LEVOTHROID) 50 MCG tablet Take 50 mcg by mouth daily.        [name] loratadine (CLARITIN) 10 MG tablet Take 10 mg by mouth daily.        . Multiple Vitamin (MULTIVITAMIN) tablet Take 1 tablet by mouth daily.        . Multiple Vitamins-Minerals (MACUVITE EYE CARE) TABS Take 1 tablet by mouth 2 (two) times daily.        . nitroGLYCERIN (NITROSTAT) 0.4 MG SL tablet Place 0.4 mg under the tongue every 5 (five) minutes as needed.        . Omega-3 Fatty Acids (FISH OIL) 1200 MG CAPS Take 1 capsule by mouth daily.        . prednisoLONE acetate (  PRED MILD) 0.12 % ophthalmic suspension as directed.        [name] Specialty Vitamins Products (PROSTATE) TABS Take 1 tablet by mouth daily.         REVIEW OF SYSTEMS: CARDIOVASCULAR:  [ ]  chest pain   [ ]  chest pressure   [ ]  palpitations   [ ]  orthopnea   Arly.Keller ] dyspnea on exertion   [ ]  claudication   [ ]  rest pain   [ ]  DVT   [ ]  phlebitis PULMONARY:   [ ]  productive cough   [ ]  asthma   [ ]  wheezing NEUROLOGIC:   [ ]  weakness  [ ]  paresthesias  [ ]  aphasia  [ ]  amaurosis  [ ]  dizziness HEMATOLOGIC:   [ ]  bleeding problems   [ ]  clotting disorders MUSCULOSKELETAL:  [ ]  joint pain   [ ]  joint swelling GASTROINTESTINAL: [ ]   blood in stool  [ ]   hematemesis GENITOURINARY:  [ ]   dysuria  [ ]   hematuria PSYCHIATRIC:  [ ]  history of major depression INTEGUMENTARY:  [ ]  rashes  [ ]  ulcers CONSTITUTIONAL:  [ ]  fever   [ ]  chills  PHYSICAL EXAM: Filed Vitals:   05/02/11 1023  BP: 149/78  Pulse: 55  Resp: 16  Height: 5\' 7"  (1.702 m)  Weight: 135 lb (61.236 kg)   Body mass index is 21.14 kg/(m^2).  GENERAL: The patient  appears their stated age. The vital signs are documented above. CARDIOVASCULAR: There is a regular rate and rhythm without significant murmur appreciated. I do not detect any carotid bruits. He has palpable femoral pulses bilaterally and palpable pedal pulses. He has no significant lower for many swelling. PULMONARY: There is good air exchange bilaterally without wheezing or rales. ABDOMEN: Soft and non-tender with normal pitched bowel sounds. His aneurysm is palpable and nontender. MUSCULOSKELETAL: There are no major deformities or cyanosis. NEUROLOGIC: No focal weakness or paresthesias are detected. SKIN: There are no ulcers or rashes noted. PSYCHIATRIC: The patient has a normal affect.  DATA:I have independently interpreted his duplex of his aneurysm which shows that the maximum diameter is 5.99 cm. Thus the aneurysm has not changed significantly in size compared to the CT scan in March of 2012. The thoracic component was significantly smaller.   MEDICAL ISSUES:given that this is a thoracoabdominal aneurysm, and given his age, he would only be considered for repair if this enlarge significantly or became symptomatic. Status this at length with the family in the past.  I have ordered a followup CT scan of the chest abdomen and pelvis in 6 months. I'll see him back at that time. I've asked that this be done without po contrast as he had significant problems when he received po contrast before. Apparently he had significant diarrhea and was in the hospital for 3-4 days.

## 2011-05-04 LAB — CBC
HCT: 25.6 — ABNORMAL LOW
HCT: 27.1 — ABNORMAL LOW
Hemoglobin: 11.4 — ABNORMAL LOW
Hemoglobin: 14.3
Hemoglobin: 8.7 — ABNORMAL LOW
MCHC: 33.7
MCHC: 33.8
MCHC: 33.9
MCHC: 34.1
MCV: 91.2
MCV: 93.5
Platelets: 188
RBC: 2.9 — ABNORMAL LOW
RBC: 4.51
RDW: 13.4
RDW: 14.9
WBC: 11.8 — ABNORMAL HIGH
WBC: 7.9

## 2011-05-04 LAB — BASIC METABOLIC PANEL
BUN: 26 — ABNORMAL HIGH
BUN: 27 — ABNORMAL HIGH
CO2: 24
CO2: 25
CO2: 25
CO2: 28
Calcium: 8 — ABNORMAL LOW
Calcium: 9.4
Chloride: 105
Chloride: 106
Chloride: 106
Creatinine, Ser: 1.41
Creatinine, Ser: 1.58 — ABNORMAL HIGH
Creatinine, Ser: 1.64 — ABNORMAL HIGH
GFR calc Af Amer: 58 — ABNORMAL LOW
Glucose, Bld: 118 — ABNORMAL HIGH
Potassium: 4.4
Sodium: 141

## 2011-05-04 LAB — URINALYSIS, ROUTINE W REFLEX MICROSCOPIC
Leukocytes, UA: NEGATIVE
Nitrite: NEGATIVE
Specific Gravity, Urine: 1.02
Urobilinogen, UA: 0.2
pH: 5.5

## 2011-05-04 LAB — TYPE AND SCREEN
ABO/RH(D): O POS
Antibody Screen: NEGATIVE

## 2011-05-04 LAB — DIFFERENTIAL
Lymphs Abs: 1.8
Monocytes Absolute: 1.1 — ABNORMAL HIGH
Monocytes Relative: 14 — ABNORMAL HIGH
Neutro Abs: 4.6
Neutrophils Relative %: 59

## 2011-05-04 LAB — CARDIAC PANEL(CRET KIN+CKTOT+MB+TROPI): Relative Index: 3 — ABNORMAL HIGH

## 2011-05-04 LAB — PROTIME-INR: INR: 1

## 2011-05-04 LAB — APTT: aPTT: 34

## 2011-05-10 NOTE — Procedures (Unsigned)
DUPLEX ULTRASOUND OF ABDOMINAL AORTA  INDICATION:  Follow up AAA.  HISTORY: Diabetes:  No. Cardiac:  MI, April 2007. Hypertension:  Yes. Smoking:  Quit in 1969. Connective Tissue Disorder: Family History:  No. Previous Surgery:  No.  DUPLEX EXAM:         AP (cm)                   TRANSVERSE (cm) Proximal             3.00 cm                   2.94 cm Mid                  4.78 cm                   4.78 cm Distal               5.95 cm                   5.99 cm Right Iliac          1.08 cm                   1.18 cm Left Iliac           1.28 cm                   1.28 cm  PREVIOUS:  Date: 06/01/2009  AP:  5.42  TRANSVERSE:  5.45  IMPRESSION: 1. Abdominal aortic aneurysm noted with largest measurement of 5.95 X     5.99 cm. 2. Slight increase in comparison to the previous examination.  ___________________________________________ Di Kindle. [name], M.D.  EM/MEDQ  D:  05/03/2011  T:  05/03/2011  Job:  [id number]

## 2011-05-21 LAB — CBC
HCT: 38.6 — ABNORMAL LOW
Hemoglobin: 13.3
MCV: 94.8
Platelets: 214
RBC: 4.07 — ABNORMAL LOW
WBC: 7.2

## 2011-05-21 LAB — I-STAT 8, (EC8 V) (CONVERTED LAB)
Bicarbonate: 24.8 — ABNORMAL HIGH
Glucose, Bld: 92
Hemoglobin: 13.3
Sodium: 140
TCO2: 26
pCO2, Ven: 45

## 2011-05-21 LAB — COMPREHENSIVE METABOLIC PANEL
ALT: 15
AST: 20
Albumin: 3.3 — ABNORMAL LOW
Alkaline Phosphatase: 90
CO2: 28
Chloride: 109
GFR calc Af Amer: 45 — ABNORMAL LOW
GFR calc non Af Amer: 37 — ABNORMAL LOW
Potassium: 4.5
Sodium: 140
Total Bilirubin: 1

## 2011-05-21 LAB — DIFFERENTIAL
Basophils Absolute: 0
Eosinophils Absolute: 0.3
Eosinophils Relative: 4
Monocytes Absolute: 1 — ABNORMAL HIGH

## 2011-05-21 LAB — CARDIAC PANEL(CRET KIN+CKTOT+MB+TROPI)
Relative Index: INVALID
Relative Index: INVALID
Total CK: 39
Troponin I: 0.03
Troponin I: 0.03

## 2011-05-21 LAB — T4, FREE: Free T4: 1.08

## 2011-10-19 ENCOUNTER — Other Ambulatory Visit: Payer: Self-pay | Admitting: Vascular Surgery

## 2011-10-19 LAB — CREATININE, SERUM: Creat: 1.44 mg/dL — ABNORMAL HIGH (ref 0.50–1.35)

## 2011-10-19 LAB — BUN: BUN: 27 mg/dL — ABNORMAL HIGH (ref 6–23)

## 2011-10-23 ENCOUNTER — Other Ambulatory Visit: Payer: Self-pay | Admitting: *Deleted

## 2011-10-23 ENCOUNTER — Encounter: Payer: Self-pay | Admitting: Vascular Surgery

## 2011-10-23 DIAGNOSIS — I714 Abdominal aortic aneurysm, without rupture: Secondary | ICD-10-CM

## 2011-10-23 DIAGNOSIS — I712 Thoracic aortic aneurysm, without rupture: Secondary | ICD-10-CM

## 2011-10-24 ENCOUNTER — Other Ambulatory Visit: Payer: Medicare Other

## 2011-10-24 ENCOUNTER — Ambulatory Visit (INDEPENDENT_AMBULATORY_CARE_PROVIDER_SITE_OTHER): Payer: Medicare Other | Admitting: Vascular Surgery

## 2011-10-24 ENCOUNTER — Ambulatory Visit
Admission: RE | Admit: 2011-10-24 | Discharge: 2011-10-24 | Disposition: A | Discharge status: Home / Self Care | Payer: Medicare Other | Source: Ambulatory Visit | Attending: Vascular Surgery | Admitting: Vascular Surgery

## 2011-10-24 ENCOUNTER — Ambulatory Visit: Payer: Medicare Other | Admitting: Vascular Surgery

## 2011-10-24 ENCOUNTER — Encounter: Payer: Self-pay | Admitting: Vascular Surgery

## 2011-10-24 VITALS — BP 142/70 | HR 62 | Resp 16 | Ht 67.0 in | Wt 134.8 lb

## 2011-10-24 DIAGNOSIS — I714 Abdominal aortic aneurysm, without rupture: Secondary | ICD-10-CM

## 2011-10-24 MED ORDER — IOHEXOL 350 MG/ML SOLN
100.0000 mL | Freq: Once | INTRAVENOUS | Status: AC | PRN
  Administered 2011-10-24: 100 mL via INTRAVENOUS

## 2011-10-24 NOTE — Progress Notes (Signed)
[facility] [place]  Patient name: [name] [name] MRN: [id number] DOB: 23-Mar-1920 Sex: male  REASON FOR VISIT: Follow up of thoracoabdominal aneurysm  HPI: DREVIN ORTNER is a 78 y.o. male who I have been following with a thoracoabdominal aneurysm. I last saw him in September of 2012. At that time the maximum diameter of his infrarenal component of the aneurysm was 5.9 cm. The thoracic component measured 4.2 cm in maximum diameter. At that time he was not interested in considering repair or referral to [facility] to be evaluated for stent graft. Since I saw him last he said no significant domino pain. He has had some mild low back pain and is been worked up and found to have spinal stenosis. He had one injection treatment which did not help his symptoms significantly. He's had no chest pain. He does have a history of significant coronary artery disease but has had no recent cardiac symptoms.  Past Medical History  Diagnosis Date  . HLD (hyperlipidemia)   . Abnormal EKG     left ventricular hypertrophy and secondary repolarization changes   . HTN (hypertension)     boarderline control   . Thoracic aortic aneurysm   . CAD in native artery     with NSTEMI and stent to circumflex coronary artery 3/11. nml left ventricular function   . PVD (peripheral vascular disease)     with thoracic abd aneurysm; follwed by Dr. Earley Brooke. signif. enlarged measuring 6 cm  . Dyslipidemia   . Abdominal aortic aneurysm     Followed by vascular surgery  . Myocardial infarction 2007  . Macular degeneration   . Spinal stenosis     pt.s daughter states about 2 weeks ago 10-09-2011 had "cortisone injection  in his back"    Family History  Problem Relation Age of Onset  . Cancer      fhx  . Stroke      fhx  . Coronary artery disease      fhx   . Heart disease Mother     angina  . Cancer Father     leukemia  . Heart disease Father     SOCIAL HISTORY: History  Substance Use Topics  .  Smoking status: Former Smoker -- 1.0 packs/day for 35 years    Types: Cigarettes    Quit date: 08/07/1967  . Smokeless tobacco: Never Used  . Alcohol Use: No    Allergies  Allergen Reactions  . Ciprofloxacin     REACTION: swelling of joints,mouth    Current Outpatient Prescriptions  Medication Sig Dispense Refill  . amLODipine (NORVASC) 5 MG tablet Take 0.5 tablets (2.5 mg total) by mouth 2 (two) times daily.  90 tablet  3  . aspirin 81 MG EC tablet Take 81 mg by mouth daily.        [name] atorvastatin (LIPITOR) 20 MG tablet Take 20 mg by mouth daily.        . clopidogrel (PLAVIX) 75 MG tablet Take 1 tablet (75 mg total) by mouth daily.  90 tablet  3  . Folic Acid-Vit B6-Vit B12 (FOLBEE) 2.5-25-1 MG TABS Take 1 tablet by mouth daily.        [name] levothyroxine (SYNTHROID, LEVOTHROID) 50 MCG tablet Take 50 mcg by mouth daily.        [name] loratadine (CLARITIN) 10 MG tablet Take 10 mg by mouth daily.        . Multiple Vitamin (MULTIVITAMIN) tablet Take 1 tablet by mouth  daily.        . Multiple Vitamins-Minerals (MACUVITE EYE CARE) TABS Take 1 tablet by mouth 2 (two) times daily.        . nitroGLYCERIN (NITROSTAT) 0.4 MG SL tablet Place 0.4 mg under the tongue every 5 (five) minutes as needed.        . Omega-3 Fatty Acids (FISH OIL) 1200 MG CAPS Take 1 capsule by mouth daily.        . prednisoLONE acetate (PRED MILD) 0.12 % ophthalmic suspension as directed.        [name] Specialty Vitamins Products (PROSTATE) TABS Take 1 tablet by mouth daily.        No current facility-administered medications for this visit.   Facility-Administered Medications Ordered in Other Visits  Medication Dose Route Frequency Provider Last Rate Last Dose  . iohexol (OMNIPAQUE) 350 MG/ML injection 100 mL  100 mL Intravenous Once PRN Medication Radiologist, MD   100 mL at 10/24/11 1201    REVIEW OF SYSTEMS: Arly.Keller ] denotes positive finding; [  ] denotes negative finding  CARDIOVASCULAR:  [ ]  chest pain   [ ]  chest pressure   [  ] palpitations   [ ]  orthopnea   [ ]  dyspnea on exertion   [ ]  claudication   [ ]  rest pain   [ ]  DVT   [ ]  phlebitis PULMONARY:   [ ]  productive cough   [ ]  asthma   [ ]  wheezing NEUROLOGIC:   [ ]  weakness  [ ]  paresthesias  [ ]  aphasia  [ ]  amaurosis  [ ]  dizziness HEMATOLOGIC:   [ ]  bleeding problems   [ ]  clotting disorders MUSCULOSKELETAL:  [ ]  joint pain   [ ]  joint swelling [ ]  leg swelling GASTROINTESTINAL: [ ]   blood in stool  [ ]   hematemesis GENITOURINARY:  [ ]   dysuria  [ ]   hematuria PSYCHIATRIC:  [ ]  history of major depression INTEGUMENTARY:  [ ]  rashes  [ ]  ulcers CONSTITUTIONAL:  [ ]  fever   [ ]  chills  PHYSICAL EXAM: Filed Vitals:   10/24/11 1311  BP: 142/70  Pulse: 62  Resp: 16  Height: 5\' 7"  (1.702 m)  Weight: 134 lb 12.8 oz (61.145 kg)   Body mass index is 21.11 kg/(m^2). GENERAL: The patient is a well-nourished male, in no acute distress. The vital signs are documented above. CARDIOVASCULAR: There is a regular rate and rhythm without significant murmur appreciated. I do not detect carotid bruits. He has palpable femoral pulses. Both feet are warm and well-perfused. He has no significant lower extremity swelling. PULMONARY: There is good air exchange bilaterally without wheezing or rales. ABDOMEN: Soft and non-tender with normal pitched bowel sounds. His aneurysm is palpable and nontender. MUSCULOSKELETAL: There are no major deformities or cyanosis. NEUROLOGIC: No focal weakness or paresthesias are detected. SKIN: There are no ulcers or rashes noted. PSYCHIATRIC: The patient has a normal affect.  DATA:  I have reviewed his CT scan of the abdomen chest and pelvis. The chest component has remained stable. At the level of the renal arteries the aneurysm measures approximately 3 cm. The infrarenal component has enlarged to 6.6 cm. Of note the radiologist did note some interval development of multiple irregular subpleural densities in the left lower lobe which were  felt to be likely inflammatory or postinflammatory and not a neoplasm although follow up was recommended.  MEDICAL ISSUES: I've explained to Mr. Alvidrez that the infrarenal component of his aneurysm has enlarged  to 6.6 cm. I think the risk of rupture is approximately 15-20% per year. I have explained to him that I think there are basically for options: 1. Referral to Dr. Bennie Pierini reviewing see Kendell Bane to see if he might be a candidate for a fenestrated graft. 2. Consideration for thoracoabdominal repair by Dr. Pattricia Boss although given his age he would clearly be at very high risk. 3. The third option would be to consider addressing only the infrarenal component of the aneurysm although this would involve sewing the graft proximally to an ectatic segment of aorta just below his renals. Although this would be a less invasive than thoracoabdominal repair, still he would be at significantly increased risk because of his age. 4. The fourth option would be to not address the aneurysm which she has considered.  I have favored proceeding with referral to Dr. Pattricia Boss in [facility] however the patient and family would like to hold off on this until they have had a chance to discuss this further. He would like to arrange for follow up study in 6 months. I will arrange for a CT scan of the chest abdomen and pelvis in 6 months and plan on seeing him back at that time and less than decide sooner to allow [place] to arrange for referral for a second opinion in [place].  Verneal Wiers S [facility] [place] Beeper: [phone]

## 2011-10-29 ENCOUNTER — Other Ambulatory Visit: Payer: Self-pay | Admitting: *Deleted

## 2011-10-29 DIAGNOSIS — I739 Peripheral vascular disease, unspecified: Secondary | ICD-10-CM

## 2011-12-11 ENCOUNTER — Encounter: Payer: Self-pay | Admitting: Family Medicine

## 2012-03-06 ENCOUNTER — Ambulatory Visit: Payer: Medicare Other | Admitting: Cardiology

## 2012-04-14 ENCOUNTER — Ambulatory Visit (INDEPENDENT_AMBULATORY_CARE_PROVIDER_SITE_OTHER): Payer: Medicare Other | Admitting: Internal Medicine

## 2012-04-14 ENCOUNTER — Encounter: Payer: Self-pay | Admitting: Internal Medicine

## 2012-04-14 VITALS — BP 120/70 | HR 59 | Ht 67.0 in | Wt 134.4 lb

## 2012-04-14 DIAGNOSIS — I480 Paroxysmal atrial fibrillation: Secondary | ICD-10-CM

## 2012-04-14 DIAGNOSIS — I716 Thoracoabdominal aortic aneurysm, without rupture: Secondary | ICD-10-CM

## 2012-04-14 DIAGNOSIS — I4891 Unspecified atrial fibrillation: Secondary | ICD-10-CM

## 2012-04-14 DIAGNOSIS — I2510 Atherosclerotic heart disease of native coronary artery without angina pectoris: Secondary | ICD-10-CM

## 2012-04-14 MED ORDER — CLOPIDOGREL BISULFATE 75 MG PO TABS: 75.0000 mg | ORAL_TABLET | Freq: Every day | ORAL | Status: DC

## 2012-04-14 NOTE — Assessment & Plan Note (Signed)
Maintaining sinus rhythm. A decision is made his aspirin as anticoagulation is also on Plavix

## 2012-04-14 NOTE — Patient Instructions (Addendum)
Continue all current medications. Your physician wants you to follow up in:  1 year.  You will receive a reminder letter in the mail one-two months in advance.  If you don't receive a letter, please call our office to schedule the follow up appointment   

## 2012-04-14 NOTE — Assessment & Plan Note (Signed)
The patient has elected not to pursue further therapies related to this. I will relate this information to Dr. Durwin Nora

## 2012-04-14 NOTE — Progress Notes (Signed)
Patient Care Team: Josue Hector, MD as PCP - General (Family Medicine) June Leap, MD (Cardiology) Shelda Pal, MD (Orthopedic Surgery)   HPI  [name] [name] is a 78 y.o. male with a history of coronary artery disease, status post non-ST elevation myocardial infarction with bare-metal stenting of the distal circumflex March 2011. The patient has nonobstructive LAD and RCA disease.  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 He saw Dr. Durwin Nora last summer at which time is asymptomatic and declined referral to [facility] for possible stent grafting. He was last seen by Dr. Durwin Nora in March who at that time recommended consultation with Dr. Pattricia Boss at [facility] for consideration of either high risk surgery or fenestrated graft insertion. The patient chose at that time to confirm further evaluation and repeat scanning was scheduled in 6 months, i.e. Now  He and his wife decided however not to pursue further therapy is regarding his aneurysm  He denies chest pain or shortness of breath. He has no peripheral edema.  He tells me that he landed on [facility]  [place] on D[place]1 with the 4 division under the leadership,  of he didn't remember,  Minna Antis   Past Medical History  Diagnosis Date  . HLD (hyperlipidemia)   . Abnormal EKG     left ventricular hypertrophy and secondary repolarization changes   . HTN (hypertension)     boarderline control   . Thoracic aortic aneurysm   . CAD in native artery     with NSTEMI and stent to circumflex coronary artery 3/11. nml left ventricular function   . PVD (peripheral vascular disease)     with thoracic abd aneurysm; follwed by Dr. Earley Brooke. signif. enlarged measuring 6 cm  . Dyslipidemia   . Abdominal aortic aneurysm     Followed by vascular surgery  . Myocardial infarction 2007  . Macular degeneration   . Spinal stenosis     pt.s daughter states  about 2 weeks ago 10-09-2011 had "cortisone injection  in his back"    Past Surgical History  Procedure Date  . Appendectomy   . Removal of bone chip 1995    right arm   . Appendectomy   . Right arm nerve transfer 1989  . Cataract extraction 1998 &2004  . Joint replacement 2009    Hip  . Enucleation 2012    Left eye removal    Current Outpatient Prescriptions  Medication Sig Dispense Refill  . amLODipine (NORVASC) 5 MG tablet Take 0.5 tablets (2.5 mg total) by mouth 2 (two) times daily.  90 tablet  3  . aspirin 81 MG EC tablet Take 81 mg by mouth daily.        [name] atorvastatin (LIPITOR) 20 MG tablet Take 20 mg by mouth daily.        . clopidogrel (PLAVIX) 75 MG tablet Take 75 mg by mouth daily.      . Folic Acid-Vit B6-Vit B12 (FOLBEE) 2.5-25-1 MG TABS Take 1 tablet by mouth daily.        [name] gabapentin (NEURONTIN) 100 MG capsule Take 300 mg by mouth daily.       [name] levothyroxine (SYNTHROID, LEVOTHROID) 50 MCG tablet Take 50 mcg by mouth daily.        [name] loratadine (CLARITIN) 10 MG tablet Take 10 mg by mouth daily.        [name]  Multiple Vitamin (MULTIVITAMIN) tablet Take 1 tablet by mouth daily.        . Multiple Vitamins-Minerals (MACUVITE EYE CARE) TABS Take 1 tablet by mouth 2 (two) times daily.        . nitroGLYCERIN (NITROSTAT) 0.4 MG SL tablet Place 0.4 mg under the tongue every 5 (five) minutes as needed.        . Omega-3 Fatty Acids (FISH OIL) 1200 MG CAPS Take 1 capsule by mouth daily.        . prednisoLONE acetate (PRED MILD) 0.12 % ophthalmic suspension as directed.        [name] Specialty Vitamins Products (PROSTATE) TABS Take 1 tablet by mouth daily.         Allergies  Allergen Reactions  . Ciprofloxacin     REACTION: swelling of joints,mouth    Review of Systems negative except from HPI and PMH  Physical Exam BP 120/70  Pulse 59  Ht 5\' 7"  (1.702 m)  Wt 134 lb 6.4 oz (60.963 kg)  BMI 21.05 kg/m2 Well developed and well nourished in no acute distress HENT normal with  hearing aid in place L eye enucleatioin Neck Supple JVP flat; carotids brisk and full Clear to ausculation regular rate and rhythm   no murmurs gallops or rub Soft with active bowel sounds No clubbing cyanosis no Edema Alert and oriented, grossly normal motor and sensory function Skin Warm and Dry  Electrocardiogram please see shows sinus rhythm at 59 intervals 24/09/48 with diffuse inferior and inferolateral T wave inversions-old  Assessment and  Plan

## 2012-04-14 NOTE — Assessment & Plan Note (Addendum)
Patient is post stenting. He is on aspirin Plavixfor some time.

## 2012-04-30 ENCOUNTER — Ambulatory Visit: Payer: Medicare Other | Admitting: Neurosurgery

## 2012-05-08 ENCOUNTER — Other Ambulatory Visit: Payer: Self-pay | Admitting: Cardiology

## 2013-01-15 IMAGING — CT CT CHEST W/ CM
2 of 7 series · 12 of 46 positions shown, 18 images · IV contrast (& [ID] OMNI 300)
Comparison: Prior examinations 10/18/2010 and 05/03/2010.

CT CHEST

CLINICAL DATA: Follow-up thoracoabdominal aortic aneurysm.  Ex-
smoker without history of malignancy.  Prior appendectomy.

CT CHEST, ABDOMEN AND PELVIS WITH CONTRAST
TECHNIQUE: Multidetector CT imaging of the chest, abdomen and
pelvis was performed following the standard protocol during bolus
administration of intravenous contrast.
Contrast: 100mL OMNIPAQUE IOHEXOL 350 MG/ML IV SOLN,

[Series 3: chest/abd/pelvis · axial · 0.66mm/px · z∈[-544,-484]mm · 2 of 125 slices shown]
[im 13/125  soft-tissue]
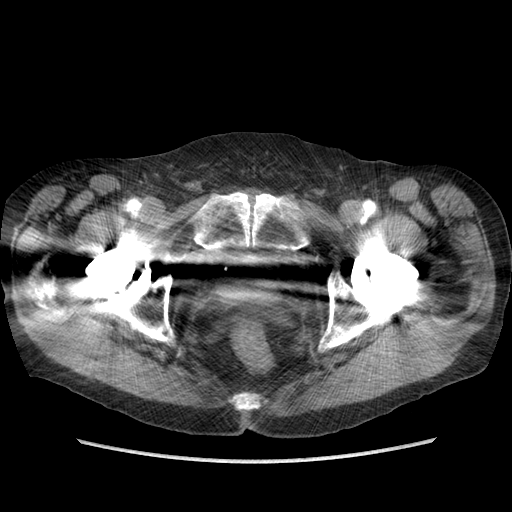
[im 25/125  soft-tissue]
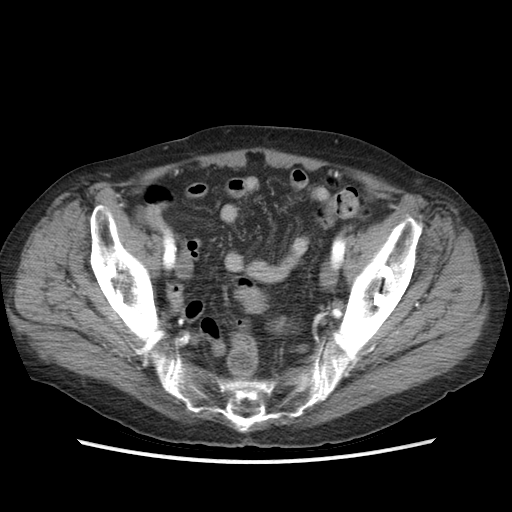

[Series 602: sagittal body · sagittal · 1.24mm/px · 10 of 137 slices shown, 16 images]
[im 13/137  soft-tissue]
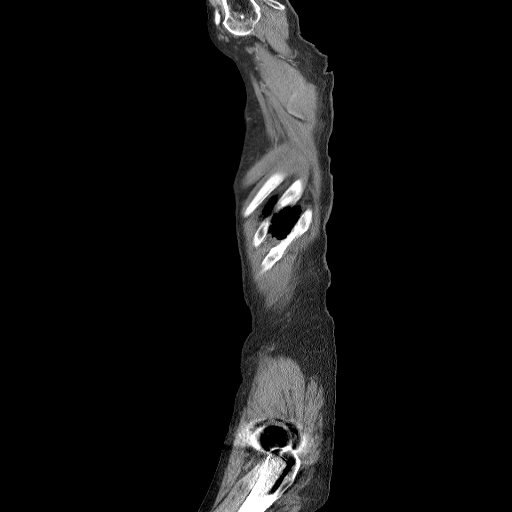
[im 13/137  lung]
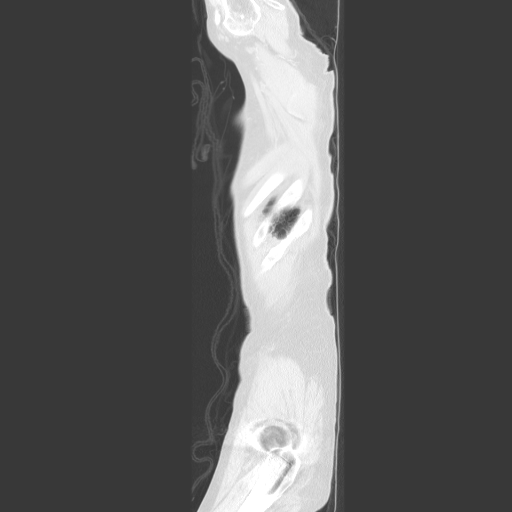
[im 13/137  bone]
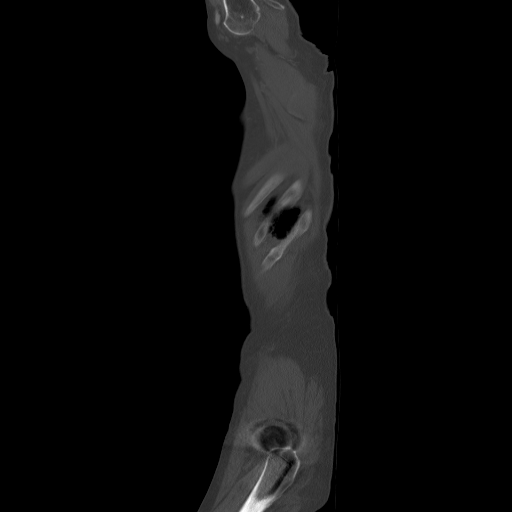
[im 25/137  soft-tissue]
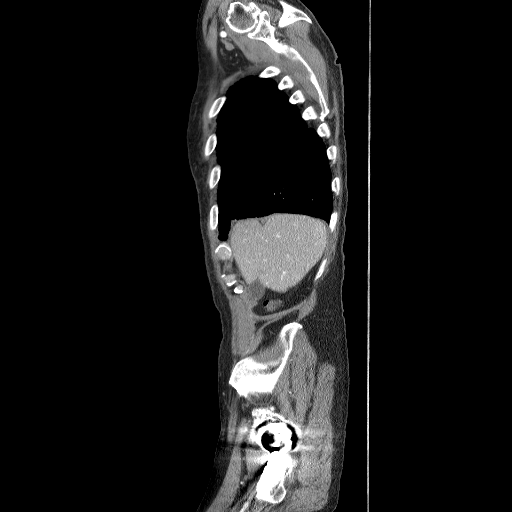
[im 25/137  lung]
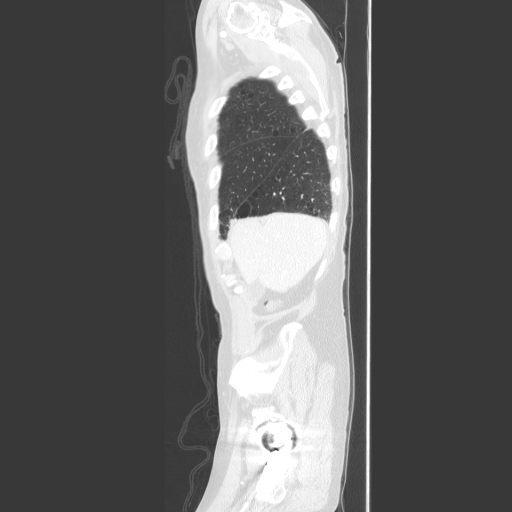
[im 38/137  soft-tissue]
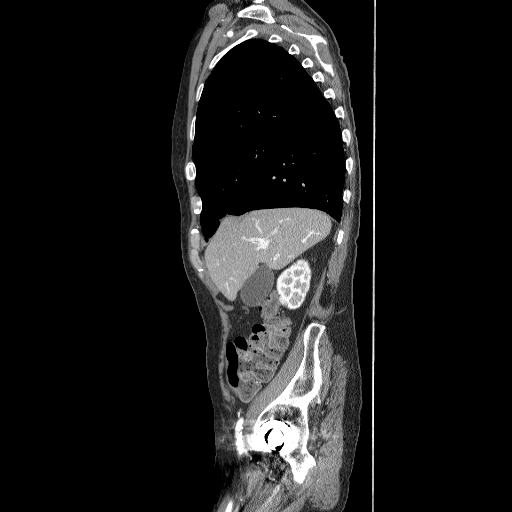
[im 38/137  lung]
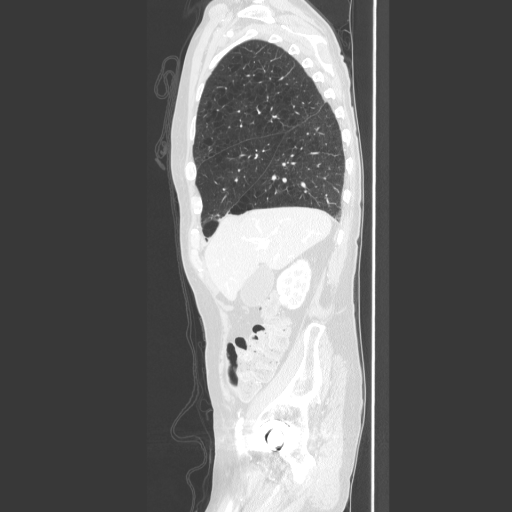
[im 50/137  soft-tissue]
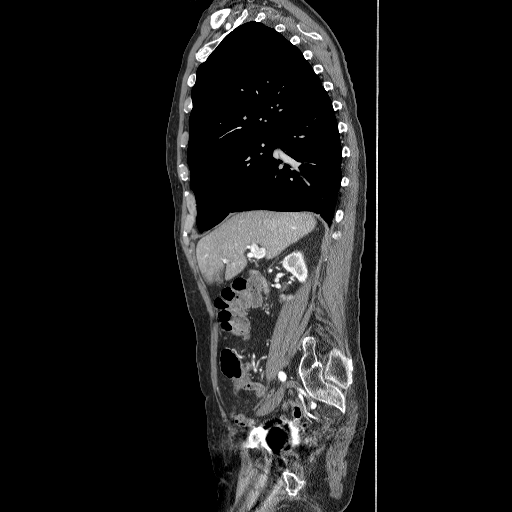
[im 50/137  lung]
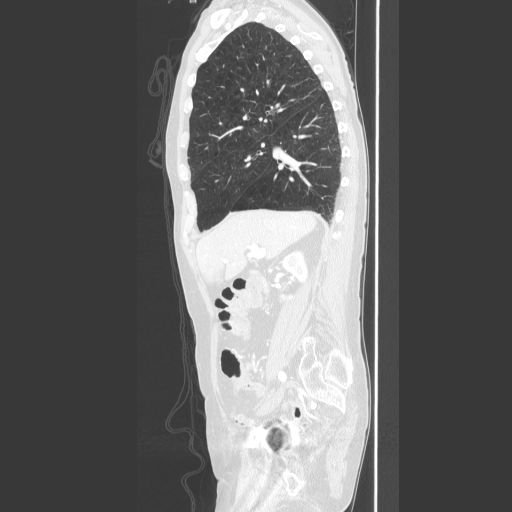
[im 62/137  soft-tissue]
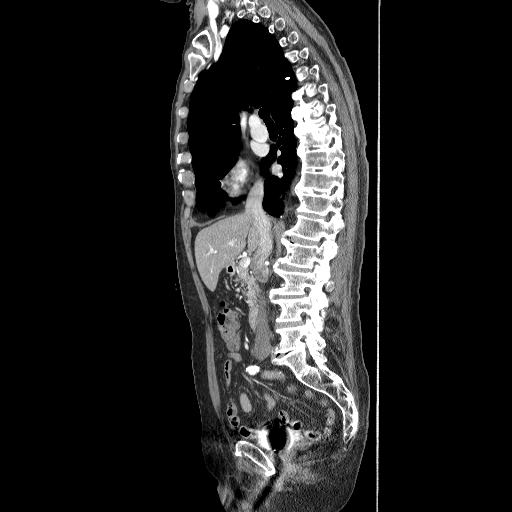
[im 75/137  soft-tissue]
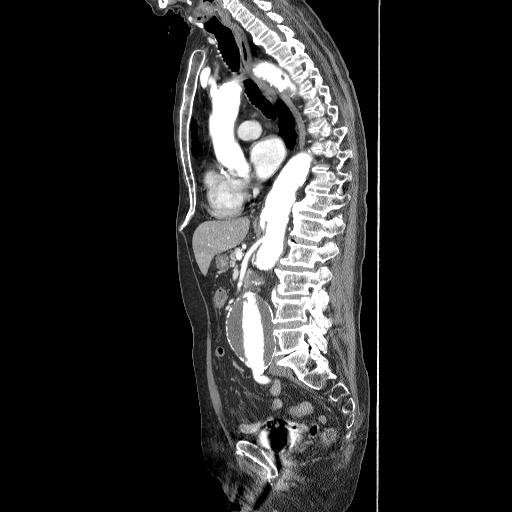
[im 87/137  soft-tissue]
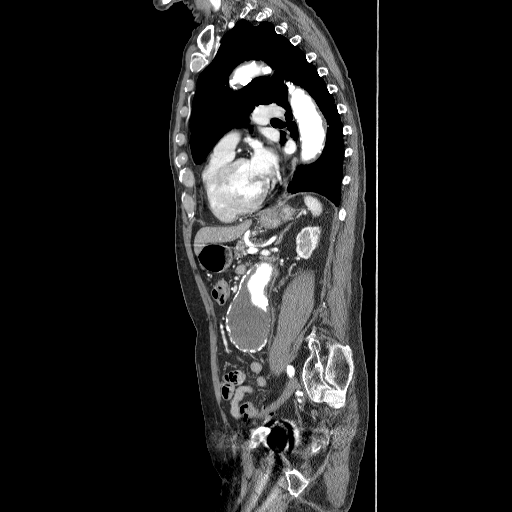
[im 99/137  soft-tissue]
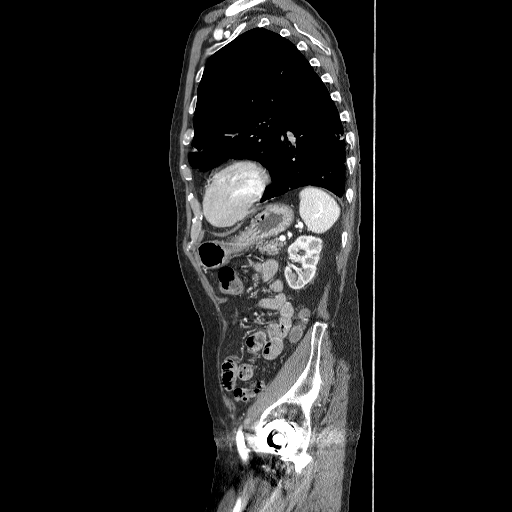
[im 112/137  soft-tissue]
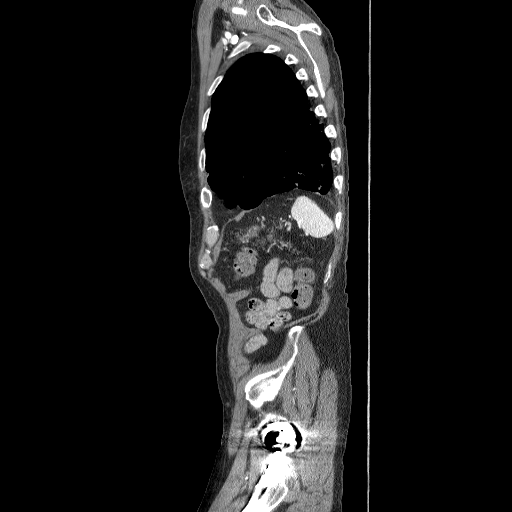
[im 112/137  bone]
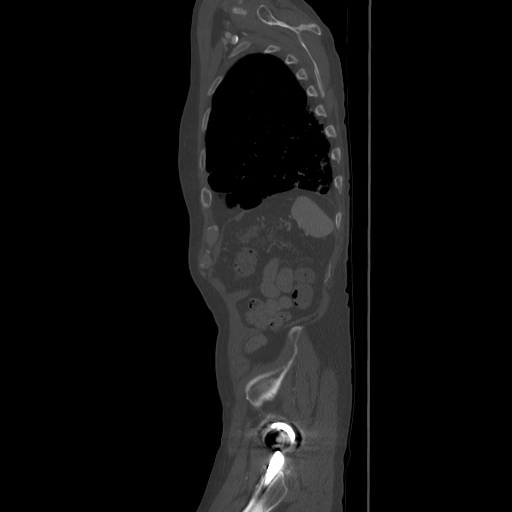
[im 124/137  soft-tissue]
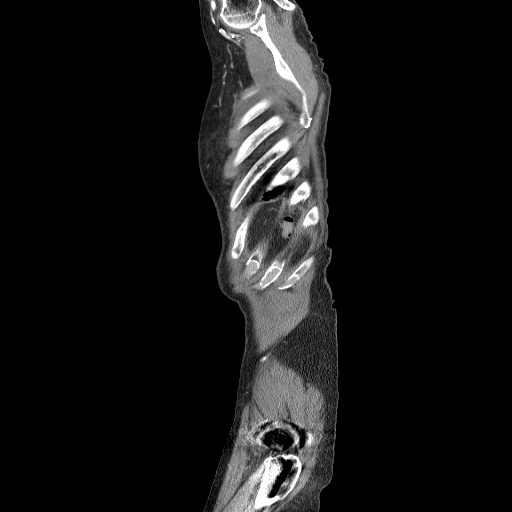

[12 of 46 positions shown; findings below may reference images not displayed]

FINDINGS: Diffuse atherosclerosis and ectasia of the thoracic
aorta are again noted.  The aorta remains mildly dilated at the
diaphragmatic hiatus, measuring 4.4 x 4.2 cm transverse on image 52
(unchanged from the prior examination as remeasured).  There is no
significant mural thrombus within the chest.  There is relatively
mild atherosclerosis of the coronary arteries and great vessels.

No enlarged mediastinal or hilar lymph nodes are identified.  There
is no pleural or pericardial effusion.

Moderate diffuse emphysematous changes are again demonstrated
throughout both lungs.  There is stable left apical scarring.
There has been interval development of several irregular peripheral
densities in the left lower lobe and lingula.  No dominant mass,
endobronchial lesion or confluent airspace opacity seen.  There are
no acute osseous findings.
IMPRESSION: 1.  Stable appearance of the diffuse thoracic aortic
atherosclerosis and ectasia.
2.  Moderate emphysema with interval development of multiple
irregular subpleural densities in the left lower lobe and lingula.
The multiplicity and interval development are most consistent with
inflammatory or postinflammatory process. This appearance would be
atypical for neoplasm; however, follow-up is recommended.

CT ABDOMEN AND PELVIS
FINDINGS: There has been further enlargement of the fusiform
infrarenal abdominal aortic aneurysm.  This now measures up to
x 6.3 cm transverse on image 81 (previously 6.4 x 6.0 cm). The
associated circumferential mural thrombus is unchanged.  There is
no evidence of acute intramural hematoma or retroperitoneal
hemorrhage.  The iliac vessels are tortuous but not dilated.  The
celiac trunk, superior mesenteric and renal arteries are patent.
There is mild luminal narrowing of the renal arteries proximally.
A patent inferior mesenteric artery is not clearly demonstrated.
No venous abnormalities are seen.

The liver, gallbladder, biliary system and pancreas appear normal.
The spleen and adrenal glands appear normal.  There is stable mild
renal cortical thinning bilaterally.  No hydronephrosis is present.
There is no lymphadenopathy or inflammatory process.

Patient is status post bilateral total hip arthroplasty.  Lumbar
spondylosis and chronic superior plate compression deformity at L4
are unchanged.
IMPRESSION: 1.  Progressive enlargement of fusiform infrarenal abdominal aortic
aneurysm, now measuring up to 6.6 cm transverse.  No evidence of
aneurysm rupture/leak.
2.  No evidence of large vessel occlusion.
3.  No acute abdominal pelvic findings.

## 2013-04-14 ENCOUNTER — Encounter: Payer: Self-pay | Admitting: Cardiovascular Disease

## 2013-04-14 ENCOUNTER — Ambulatory Visit (INDEPENDENT_AMBULATORY_CARE_PROVIDER_SITE_OTHER): Payer: Medicare Other | Admitting: Cardiovascular Disease

## 2013-04-14 VITALS — BP 123/74 | HR 59 | Ht 67.0 in | Wt 131.8 lb

## 2013-04-14 DIAGNOSIS — I716 Thoracoabdominal aortic aneurysm, without rupture, unspecified: Secondary | ICD-10-CM

## 2013-04-14 DIAGNOSIS — I4891 Unspecified atrial fibrillation: Secondary | ICD-10-CM

## 2013-04-14 DIAGNOSIS — I480 Paroxysmal atrial fibrillation: Secondary | ICD-10-CM

## 2013-04-14 DIAGNOSIS — I2510 Atherosclerotic heart disease of native coronary artery without angina pectoris: Secondary | ICD-10-CM

## 2013-04-14 MED ORDER — AMLODIPINE BESYLATE 2.5 MG PO TABS: 2.5000 mg | ORAL_TABLET | Freq: Two times a day (BID) | ORAL | Status: DC

## 2013-04-14 MED ORDER — CLOPIDOGREL BISULFATE 75 MG PO TABS: 75.0000 mg | ORAL_TABLET | Freq: Every day | ORAL | Status: DC

## 2013-04-14 NOTE — Patient Instructions (Addendum)
   Refill sent to pharmacy for Amlodipine & Plavix.  (Amlodipine - 2.5mg  tablet sent to pharm instead of 5mg  so patient will not have to break tablet in half) Continue all current medications. Your physician wants you to follow up in: 6 months.  You will receive a reminder letter in the mail one-two months in advance.  If you don't receive a letter, please call our office to schedule the follow up appointment

## 2013-04-14 NOTE — Progress Notes (Signed)
Patient ID: [name] [name], male   DOB: Sep 06, 1919, 79 y.o.   MRN: [id number]   SUBJECTIVE:  [name] is a 79 y.o. male with a history of coronary artery disease, status post non-ST elevation myocardial infarction with bare-metal stenting of the distal circumflex March 2011. The patient has nonobstructive LAD and RCA disease.   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 Durwin Nora and declined referral to [facility] for possible stent grafting. He was then again seen by Dr. Durwin Nora who at that time recommended consultation with Dr. Pattricia Boss at [facility] for consideration of either high risk surgery or fenestrated graft insertion. Ultimately, both he and his wife decided however not to pursue further therapy regarding his aneurysm.  He denies chest pain and has some mild shortness of breath with exertion, but this hasn't progressed in the past year. He denies dizziness and syncope. He takes Meclizine. He denies leg swelling.  He walks over a mile up to twice a day.  He tells me that he landed on [place] the [place] with the 4[place].  He is present with his daughter, [name] [name], a retired Psychologist, forensic from [place]. He has another daughter who taught PE at the same school.     Allergies  Allergen Reactions  . Ciprofloxacin     REACTION: swelling of joints,mouth    Current Outpatient Prescriptions  Medication Sig Dispense Refill  . amLODipine (NORVASC) 5 MG tablet TAKE HALF A TABLET BY MOUTH TWICE DAILY  90 tablet  3  . aspirin 81 MG EC tablet Take 81 mg by mouth daily.        [name] atorvastatin (LIPITOR) 20 MG tablet Take 20 mg by mouth daily.        . clopidogrel (PLAVIX) 75 MG tablet Take 1 tablet (75 mg total) by mouth daily.  90 tablet  3  . Folic Acid-Vit B6-Vit B12 (FOLBEE) 2.5-25-1 MG TABS Take 1 tablet by mouth daily.        [name] gabapentin (NEURONTIN) 100 MG capsule Take 300 mg by mouth daily.       [name] levothyroxine (SYNTHROID, LEVOTHROID) 50  MCG tablet Take 50 mcg by mouth daily.        [name] loratadine (CLARITIN) 10 MG tablet Take 10 mg by mouth daily.        . Multiple Vitamin (MULTIVITAMIN) tablet Take 1 tablet by mouth daily.        . Multiple Vitamins-Minerals (MACUVITE EYE CARE) TABS Take 1 tablet by mouth 2 (two) times daily.        . nitroGLYCERIN (NITROSTAT) 0.4 MG SL tablet Place 0.4 mg under the tongue every 5 (five) minutes as needed.        . Omega-3 Fatty Acids (FISH OIL) 1200 MG CAPS Take 1 capsule by mouth daily.        . prednisoLONE acetate (PRED MILD) 0.12 % ophthalmic suspension as directed.        [name] Specialty Vitamins Products (PROSTATE) TABS Take 1 tablet by mouth daily.        No current facility-administered medications for this visit.    Past Medical History  Diagnosis Date  . HLD (hyperlipidemia)   . Abnormal EKG     left ventricular hypertrophy and secondary repolarization changes   . HTN (hypertension)     boarderline control   . Thoracic aortic aneurysm   . CAD in native artery  with NSTEMI and stent to circumflex coronary artery 3/11. nml left ventricular function   . PVD (peripheral vascular disease)     with thoracic abd aneurysm; follwed by Dr. Earley Brooke. signif. enlarged measuring 6 cm  . Dyslipidemia   . Abdominal aortic aneurysm     Followed by vascular surgery  . Myocardial infarction 2007  . Macular degeneration   . Spinal stenosis     pt.s daughter states about 2 weeks ago 10-09-2011 had "cortisone injection  in his back"    Past Surgical History  Procedure Laterality Date  . Appendectomy    . Removal of bone chip  1995    right arm   . Appendectomy    . Right arm nerve transfer  1989  . Cataract extraction  1998 &2004  . Joint replacement  2009    Hip  . Enucleation  2012    Left eye removal    History   Social History  . Marital Status: Married    Spouse Name: N/A    Number of Children: N/A  . Years of Education: N/A   Occupational History  . Not on file.    Social History Main Topics  . Smoking status: Former Smoker -- 1.00 packs/day for 35 years    Types: Cigarettes    Quit date: 08/07/1967  . Smokeless tobacco: Never Used  . Alcohol Use: No  . Drug Use: No  . Sexual Activity: Not on file   Other Topics Concern  . Not on file   Social History Narrative   Married, retired [employer] vet     BP 123/74 Pulse 59   PHYSICAL EXAM General: NAD Neck: No JVD, no thyromegaly or thyroid nodule.  Lungs: Clear to auscultation bilaterally with normal respiratory effort. CV: Nondisplaced PMI.  Heart regular S1/S2, no S3/S4, no murmur.  No peripheral edema.  No carotid bruit.  Normal pedal pulses.  Abdomen: Soft, nontender, no hepatosplenomegaly, no distention.  Neurologic: Alert and oriented x 3.  Psych: Normal affect. Extremities: No clubbing or cyanosis.   ECG: reviewed and available in electronic records. (sinus rhythm, 59 bpm, early R wave transition, diffuse T wave inversion)      ASSESSMENT AND PLAN: 1. CAD: on ASA, Plavix, and Lipitor. Stable/asymptomatic. 2. Paroxysmal atrial fibrillation: denies palpitations. 3. HTN: controlled on Amlodipine. 4. PVD: no further workup regarding his aneurysm.   Prentice Docker, M.D., F.A.C.C.

## 2013-06-11 ENCOUNTER — Other Ambulatory Visit: Payer: Self-pay

## 2013-11-05 ENCOUNTER — Ambulatory Visit (INDEPENDENT_AMBULATORY_CARE_PROVIDER_SITE_OTHER): Payer: Medicare Other | Admitting: Cardiovascular Disease

## 2013-11-05 ENCOUNTER — Encounter: Payer: Self-pay | Admitting: Cardiovascular Disease

## 2013-11-05 VITALS — BP 150/76 | HR 59 | Ht 66.0 in | Wt 139.4 lb

## 2013-11-05 DIAGNOSIS — I10 Essential (primary) hypertension: Secondary | ICD-10-CM

## 2013-11-05 DIAGNOSIS — I716 Thoracoabdominal aortic aneurysm, without rupture, unspecified: Secondary | ICD-10-CM

## 2013-11-05 DIAGNOSIS — I4891 Unspecified atrial fibrillation: Secondary | ICD-10-CM

## 2013-11-05 DIAGNOSIS — I2510 Atherosclerotic heart disease of native coronary artery without angina pectoris: Secondary | ICD-10-CM

## 2013-11-05 DIAGNOSIS — I480 Paroxysmal atrial fibrillation: Secondary | ICD-10-CM

## 2013-11-05 NOTE — Patient Instructions (Signed)
Continue all current medications. Your physician wants you to follow up in: 6 months.  You will receive a reminder letter in the mail one-two months in advance.  If you don't receive a letter, please call our office to schedule the follow up appointment   

## 2013-11-05 NOTE — Progress Notes (Signed)
Patient ID: [name] [name], male   DOB: Nov 29, 1919, 80 y.o.   MRN: [id number]      SUBJECTIVE: [name] [name] is a 80 y.o. male with a history of coronary artery disease, status post non-ST elevation myocardial infarction with bare-metal stenting of the distal circumflex March 2011. The patient has nonobstructive LAD and RCA disease.  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 Durwin Nora and declined referral to [facility] for possible stent grafting. He was then again seen by Dr. Durwin Nora who at that time recommended consultation with Dr. Pattricia Boss at [facility] for consideration of either high risk surgery or fenestrated graft insertion. Ultimately, both he and his wife decided however not to pursue further therapy regarding his aneurysm.   He denies chest pain and has some mild shortness of breath with exertion, but this hasn't progressed in the past year, and this is attributed to allergies and bronchitis as per his daughter, [name] [name]. He denies dizziness and syncope. He takes Meclizine.  He may have some mild leg swelling after walking, but this resolves soon afterwards. 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 He is very hard of hearing.     Soc: He landed on [place] on the [place] after [place] with the 4[place] division.  He is present with his daughter, [name] [name], a retired Psychologist, forensic from [place]. He has another daughter who taught PE at the same school.  He lives in [place].       Allergies  Allergen Reactions  . Ciprofloxacin     REACTION: swelling of joints,mouth    Current Outpatient Prescriptions  Medication Sig Dispense Refill  . amLODipine (NORVASC) 2.5 MG tablet Take 1 tablet (2.5 mg total) by mouth 2 (two) times daily.  180 tablet  3  . aspirin 81 MG EC tablet Take 81 mg by mouth daily.        [name] atorvastatin (LIPITOR) 20 MG tablet Take 20 mg by mouth daily.        . clopidogrel (PLAVIX) 75 MG tablet Take 1 tablet (75 mg total)  by mouth daily.  90 tablet  3  . erythromycin ophthalmic ointment Place 1 application into the right eye at bedtime.      . fluticasone (FLONASE) 50 MCG/ACT nasal spray Place 1 spray into the nose as needed.       . Folic Acid-Vit B6-Vit B12 (FOLBEE) 2.5-25-1 MG TABS Take 1 tablet by mouth daily.        [name] gabapentin (NEURONTIN) 100 MG capsule Take 300 mg by mouth daily.       [name] levothyroxine (SYNTHROID, LEVOTHROID) 50 MCG tablet Take 50 mcg by mouth daily.        [name] loratadine (CLARITIN) 10 MG tablet Take 10 mg by mouth daily.        . meclizine (ANTIVERT) 12.5 MG tablet Take 12.5 mg by mouth 2 (two) times daily.       . Multiple Vitamin (MULTIVITAMIN) tablet Take 1 tablet by mouth daily.        . Multiple Vitamins-Minerals (MACUVITE EYE CARE) TABS Take 1 tablet by mouth 2 (two) times daily.        . nitroGLYCERIN (NITROSTAT) 0.4 MG SL tablet Place 0.4 mg under the tongue every 5 (five) minutes as needed.        . Omega-3 Fatty Acids (FISH OIL) 1200 MG CAPS Take 1 capsule by mouth  daily.        . prednisoLONE acetate (PRED MILD) 0.12 % ophthalmic suspension as directed.        [name]. Specialty Vitamins Products (PROSTATE) TABS Take 1 tablet by mouth daily.        No current facility-administered medications for this visit.    Past Medical History  Diagnosis Date  . HLD (hyperlipidemia)   . Abnormal EKG     left ventricular hypertrophy and secondary repolarization changes   . HTN (hypertension)     boarderline control   . Thoracic aortic aneurysm   . CAD in native artery     with NSTEMI and stent to circumflex coronary artery 3/11. nml left ventricular function   . PVD (peripheral vascular disease)     with thoracic abd aneurysm; follwed by Dr. Earley Brookeickonson. signif. enlarged measuring 6 cm  . Dyslipidemia   . Abdominal aortic aneurysm     Followed by vascular surgery  . Myocardial infarction 2007  . Macular degeneration   . Spinal stenosis     pt.s daughter states about 2 weeks ago  10-09-2011 had "cortisone injection  in his back"    Past Surgical History  Procedure Laterality Date  . Appendectomy    . Removal of bone chip  1995    right arm   . Appendectomy    . Right arm nerve transfer  1989  . Cataract extraction  1998 &2004  . Joint replacement  2009    Hip  . Enucleation  2012    Left eye removal    History   Social History  . Marital Status: Married    Spouse Name: N/A    Number of Children: N/A  . Years of Education: N/A   Occupational History  . Not on file.   Social History Main Topics  . Smoking status: Former Smoker -- 1.00 packs/day for 35 years    Types: Cigarettes    Quit date: 08/07/1967  . Smokeless tobacco: Never Used  . Alcohol Use: No  . Drug Use: No  . Sexual Activity: Not on file   Other Topics Concern  . Not on file   Social History Narrative   Married, retired [employer] vet     Filed Vitals:   11/05/13 1317  BP: 150/76  Pulse: 59  Height: 5\' 6"  (1.676 m)  Weight: 139 lb 6.4 oz (63.231 kg)  SpO2: 97%    PHYSICAL EXAM General: NAD Neck: No JVD, no thyromegaly. Lungs: Clear to auscultation bilaterally with normal respiratory effort. CV: Nondisplaced PMI.  Regular rate and rhythm, normal S1/S2, no S3/S4, no murmur. No pretibial or periankle edema.  No carotid bruit.  Normal pedal pulses.  Abdomen: Soft, nontender, no hepatosplenomegaly, no distention.  Neurologic: Alert and oriented x 3.  Psych: Normal affect. Extremities: No clubbing or cyanosis.   ECG: reviewed and available in electronic records.      ASSESSMENT AND PLAN: 1. CAD: on ASA, Plavix, and Lipitor. Stable/asymptomatic.  2. Paroxysmal atrial fibrillation: denies palpitations.  3. HTN: reasonably controlled for age on amlodipine.  4. PVD: no further workup regarding his aneurysm.  Dispo: f/u 6 months.  Prentice DockerSuresh Koneswaran, M.D., F.A.C.C.

## 2014-04-14 ENCOUNTER — Encounter: Payer: Self-pay | Admitting: Cardiovascular Disease

## 2014-04-14 ENCOUNTER — Ambulatory Visit (INDEPENDENT_AMBULATORY_CARE_PROVIDER_SITE_OTHER): Payer: Medicare Other | Admitting: Cardiovascular Disease

## 2014-04-14 VITALS — BP 138/77 | HR 55 | Ht 66.0 in | Wt 135.0 lb

## 2014-04-14 DIAGNOSIS — I4891 Unspecified atrial fibrillation: Secondary | ICD-10-CM

## 2014-04-14 DIAGNOSIS — I10 Essential (primary) hypertension: Secondary | ICD-10-CM

## 2014-04-14 DIAGNOSIS — I716 Thoracoabdominal aortic aneurysm, without rupture, unspecified: Secondary | ICD-10-CM

## 2014-04-14 DIAGNOSIS — I480 Paroxysmal atrial fibrillation: Secondary | ICD-10-CM

## 2014-04-14 DIAGNOSIS — I2510 Atherosclerotic heart disease of native coronary artery without angina pectoris: Secondary | ICD-10-CM

## 2014-04-14 MED ORDER — AMLODIPINE BESYLATE 5 MG PO TABS: 5.0000 mg | ORAL_TABLET | Freq: Every day | ORAL | Status: DC

## 2014-04-14 MED ORDER — CLOPIDOGREL BISULFATE 75 MG PO TABS: 75.0000 mg | ORAL_TABLET | Freq: Every day | ORAL | Status: DC

## 2014-04-14 NOTE — Patient Instructions (Signed)
There were no changes to your medications. Continue as directed. Plavix and Amlodipine refills sent to pharmacy.  Your physician wants you to follow up in: 6 months.  You will receive a reminder letter in the mail one-two months in advance.  If you don't receive a letter, please call our office to schedule the follow up appointment.

## 2014-04-14 NOTE — Progress Notes (Signed)
Patient ID: LEO FRAY, male   DOB: 28-Jul-1920, 80 y.o.   MRN: [id number]      SUBJECTIVE: Mr. Goody is a 80 y.o. male with a history of coronary artery disease, status post non-ST elevation myocardial infarction with bare-metal stenting of the distal circumflex March 2011. The patient has nonobstructive LAD and RCA disease.  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 Durwin Nora and declined referral to [facility] for possible stent grafting. He was then again seen by Dr. Durwin Nora who at that time recommended consultation with Dr. Pattricia Boss at [facility] for consideration of either high risk surgery or fenestrated graft insertion. Ultimately, both he and his wife decided however not to pursue further therapy regarding his aneurysm.   He denies chest pain and has some mild shortness of breath with exertion, but this hasn't progressed in the past year, and this is attributed to allergies and bronchitis as per his daughter, Jinx.  His daughter says he has had some progressive memory loss and staggers a bit but has not fallen. 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  He is very hard of hearing.   Soc: He [place] on 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  He is present with his daughter, [name], a retired Psychologist, forensic from [place]. He has another daughter who taught PE at the same school.  He lives in [place].     Review of Systems: As per "subjective", otherwise negative.  Allergies  Allergen Reactions  . Ciprofloxacin     REACTION: swelling of joints,mouth    Current Outpatient Prescriptions  Medication Sig Dispense Refill  . Fluticasone-Salmeterol (ADVAIR DISKUS) 250-50 MCG/DOSE AEPB Inhale 1 puff into the lungs 2 (two) times daily.      [name] albuterol (PROVENTIL  HFA;VENTOLIN HFA) 108 (90 BASE) MCG/ACT inhaler Inhale 2 puffs into the lungs every 6 (six) hours as needed.      [name] amLODipine (NORVASC) 5 MG tablet Take 5 mg by mouth daily.      [name] aspirin 81 MG EC tablet Take 81 mg by mouth daily.        [name] atorvastatin (LIPITOR) 10 MG tablet Take 1 tablet by mouth daily.      . clopidogrel (PLAVIX) 75 MG tablet Take 1 tablet (75 mg total) by mouth daily.  90 tablet  3  . erythromycin ophthalmic ointment Place 1 application into the right eye at bedtime.      . fluticasone (FLONASE) 50 MCG/ACT nasal spray Place 1 spray into the nose as needed.       . Folic Acid-Vit B6-Vit B12 (FOLBEE) 2.5-25-1 MG TABS Take 1 tablet by mouth daily.        [name] gabapentin (NEURONTIN) 100 MG capsule Take 300 mg by mouth daily.       [name] levothyroxine (SYNTHROID, LEVOTHROID) 50 MCG tablet Take 50 mcg by mouth daily.        [name] loratadine (CLARITIN) 10 MG tablet Take 10 mg by mouth daily.        . meclizine (ANTIVERT) 12.5 MG tablet Take 12.5 mg by mouth 2 (two) times daily.       . Multiple Vitamin (MULTIVITAMIN) tablet  Take 1 tablet by mouth daily.        . Multiple Vitamins-Minerals (MACUVITE EYE CARE) TABS Take 1 tablet by mouth 2 (two) times daily.        . nitroGLYCERIN (NITROSTAT) 0.4 MG SL tablet Place 0.4 mg under the tongue every 5 (five) minutes as needed.        . Omega-3 Fatty Acids (FISH OIL) 1200 MG CAPS Take 1 capsule by mouth daily.        . prednisoLONE acetate (PRED MILD) 0.12 % ophthalmic suspension as directed.        [name] Specialty Vitamins Products (PROSTATE) TABS Take 1 tablet by mouth daily.        No current facility-administered medications for this visit.    Past Medical History  Diagnosis Date  . HLD (hyperlipidemia)   . Abnormal EKG     left ventricular hypertrophy and secondary repolarization changes   . HTN (hypertension)     boarderline control   . Thoracic aortic aneurysm   . CAD in native artery     with NSTEMI and stent to circumflex coronary  artery 3/11. nml left ventricular function   . PVD (peripheral vascular disease)     with thoracic abd aneurysm; follwed by Dr. Earley Brooke. signif. enlarged measuring 6 cm  . Dyslipidemia   . Abdominal aortic aneurysm     Followed by vascular surgery  . Myocardial infarction 2007  . Macular degeneration   . Spinal stenosis     pt.s daughter states about 2 weeks ago 10-09-2011 had "cortisone injection  in his back"    Past Surgical History  Procedure Laterality Date  . Appendectomy    . Removal of bone chip  1995    right arm   . Appendectomy    . Right arm nerve transfer  1989  . Cataract extraction  1998 &2004  . Joint replacement  2009    Hip  . Enucleation  2012    Left eye removal    History   Social History  . Marital Status: Married    Spouse Name: N/A    Number of Children: N/A  . Years of Education: N/A   Occupational History  . Not on file.   Social History Main Topics  . Smoking status: Former Smoker -- 1.00 packs/day for 35 years    Types: Cigarettes    Start date: 07/02/1931    Quit date: 08/07/1967  . Smokeless tobacco: Never Used  . Alcohol Use: No  . Drug Use: No  . Sexual Activity: Not on file   Other Topics Concern  . Not on file   Social History Narrative   Married, retired [employer] vet     Filed Vitals:   04/14/14 0833  Height:  (1.676 m)  Weight: 135 lb (61.236 kg)   BP 138/77  Pulse 55   PHYSICAL EXAM General: NAD HEENT: Normal. Neck: No JVD, no thyromegaly. Lungs: Faint intermittent dry crackles on left, scattered. No wheezes. CV: Nondisplaced PMI.  Regular rate and rhythm, normal S1/S2, no S3/S4, no murmur. No pretibial or periankle edema.  No carotid bruit.  Normal pedal pulses.  Abdomen: Soft, nontender, no hepatosplenomegaly, no distention.  Neurologic: Alert and oriented x 3.  Psych: Normal affect. Skin: Normal. Musculoskeletal: Normal range of motion, no gross deformities. Extremities: No clubbing or cyanosis.    ECG: Most recent ECG reviewed.      ASSESSMENT AND PLAN: 1. CAD: Stable ischemic heart disease. Continue ASA,  Plavix, and Lipitor.  2. Paroxysmal atrial fibrillation: Asymptomatic.  3. Essential HTN: Well controlled on amlodipine.  4. PVD: No further workup regarding his aneurysm.   Dispo: f/u 6 months.   Prentice Docker, M.D., F.A.C.C.

## 2014-04-20 ENCOUNTER — Other Ambulatory Visit: Payer: Self-pay | Admitting: *Deleted

## 2014-04-20 MED ORDER — AMLODIPINE BESYLATE 2.5 MG PO TABS: 2.5000 mg | ORAL_TABLET | Freq: Two times a day (BID) | ORAL | Status: DC

## 2014-06-01 ENCOUNTER — Encounter (HOSPITAL_COMMUNITY): Payer: Self-pay | Admitting: Emergency Medicine

## 2014-06-01 ENCOUNTER — Emergency Department (HOSPITAL_COMMUNITY)
Admission: EM | Admit: 2014-06-01 | Discharge: 2014-06-01 | Disposition: A | Discharge status: Home / Self Care | Payer: Medicare Other | Attending: Emergency Medicine | Admitting: Emergency Medicine

## 2014-06-01 ENCOUNTER — Emergency Department (HOSPITAL_COMMUNITY): Payer: Medicare Other

## 2014-06-01 DIAGNOSIS — Z79899 Other long term (current) drug therapy: Secondary | ICD-10-CM | POA: Insufficient documentation

## 2014-06-01 DIAGNOSIS — E785 Hyperlipidemia, unspecified: Secondary | ICD-10-CM | POA: Diagnosis not present

## 2014-06-01 DIAGNOSIS — Z87891 Personal history of nicotine dependence: Secondary | ICD-10-CM | POA: Insufficient documentation

## 2014-06-01 DIAGNOSIS — Z7982 Long term (current) use of aspirin: Secondary | ICD-10-CM | POA: Diagnosis not present

## 2014-06-01 DIAGNOSIS — Z8669 Personal history of other diseases of the nervous system and sense organs: Secondary | ICD-10-CM | POA: Diagnosis not present

## 2014-06-01 DIAGNOSIS — I252 Old myocardial infarction: Secondary | ICD-10-CM | POA: Diagnosis not present

## 2014-06-01 DIAGNOSIS — Z8739 Personal history of other diseases of the musculoskeletal system and connective tissue: Secondary | ICD-10-CM | POA: Insufficient documentation

## 2014-06-01 DIAGNOSIS — R1032 Left lower quadrant pain: Secondary | ICD-10-CM | POA: Diagnosis not present

## 2014-06-01 DIAGNOSIS — I714 Abdominal aortic aneurysm, without rupture, unspecified: Secondary | ICD-10-CM

## 2014-06-01 DIAGNOSIS — Z7902 Long term (current) use of antithrombotics/antiplatelets: Secondary | ICD-10-CM | POA: Diagnosis not present

## 2014-06-01 DIAGNOSIS — I2510 Atherosclerotic heart disease of native coronary artery without angina pectoris: Secondary | ICD-10-CM | POA: Insufficient documentation

## 2014-06-01 DIAGNOSIS — I10 Essential (primary) hypertension: Secondary | ICD-10-CM | POA: Diagnosis not present

## 2014-06-01 DIAGNOSIS — Z7952 Long term (current) use of systemic steroids: Secondary | ICD-10-CM | POA: Diagnosis not present

## 2014-06-01 DIAGNOSIS — R109 Unspecified abdominal pain: Secondary | ICD-10-CM | POA: Diagnosis present

## 2014-06-01 LAB — COMPREHENSIVE METABOLIC PANEL
ALT: 24 U/L (ref 0–53)
AST: 34 U/L (ref 0–37)
Albumin: 4 g/dL (ref 3.5–5.2)
Alkaline Phosphatase: 108 U/L (ref 39–117)
Anion gap: 14 (ref 5–15)
BUN: 24 mg/dL — ABNORMAL HIGH (ref 6–23)
CALCIUM: 9.9 mg/dL (ref 8.4–10.5)
CO2: 25 meq/L (ref 19–32)
CREATININE: 1.68 mg/dL — AB (ref 0.50–1.35)
Chloride: 104 mEq/L (ref 96–112)
GFR, EST AFRICAN AMERICAN: 39 mL/min — AB (ref >90)
GFR, EST NON AFRICAN AMERICAN: 34 mL/min — AB (ref >90)
Glucose, Bld: 120 mg/dL — ABNORMAL HIGH (ref 70–99)
Potassium: 4.6 mEq/L (ref 3.7–5.3)
SODIUM: 143 meq/L (ref 137–147)
TOTAL PROTEIN: 8.2 g/dL (ref 6.0–8.3)
Total Bilirubin: 0.5 mg/dL (ref 0.3–1.2)

## 2014-06-01 LAB — URINALYSIS, ROUTINE W REFLEX MICROSCOPIC
Bilirubin Urine: NEGATIVE
GLUCOSE, UA: NEGATIVE mg/dL
HGB URINE DIPSTICK: NEGATIVE
Ketones, ur: NEGATIVE mg/dL
Leukocytes, UA: NEGATIVE
Nitrite: NEGATIVE
PH: 7 (ref 5.0–8.0)
Protein, ur: >300 mg/dL — AB
SPECIFIC GRAVITY, URINE: 1.012 (ref 1.005–1.030)
Urobilinogen, UA: 0.2 mg/dL (ref 0.0–1.0)

## 2014-06-01 LAB — CBC WITH DIFFERENTIAL/PLATELET
Basophils Absolute: 0 10*3/uL (ref 0.0–0.1)
Basophils Relative: 1 % (ref 0–1)
EOS ABS: 0.3 10*3/uL (ref 0.0–0.7)
EOS PCT: 5 % (ref 0–5)
HEMATOCRIT: 43.3 % (ref 39.0–52.0)
Hemoglobin: 14.6 g/dL (ref 13.0–17.0)
LYMPHS PCT: 28 % (ref 12–46)
Lymphs Abs: 1.5 10*3/uL (ref 0.7–4.0)
MCH: 32 pg (ref 26.0–34.0)
MCHC: 33.7 g/dL (ref 30.0–36.0)
MCV: 95 fL (ref 78.0–100.0)
MONO ABS: 0.6 10*3/uL (ref 0.1–1.0)
Monocytes Relative: 10 % (ref 3–12)
Neutro Abs: 3.2 10*3/uL (ref 1.7–7.7)
Neutrophils Relative %: 56 % (ref 43–77)
PLATELETS: 184 10*3/uL (ref 150–400)
RBC: 4.56 MIL/uL (ref 4.22–5.81)
RDW: 14.1 % (ref 11.5–15.5)
WBC: 5.6 10*3/uL (ref 4.0–10.5)

## 2014-06-01 LAB — URINE MICROSCOPIC-ADD ON

## 2014-06-01 LAB — LIPASE, BLOOD: LIPASE: 34 U/L (ref 11–59)

## 2014-06-01 LAB — LACTIC ACID, PLASMA: LACTIC ACID, VENOUS: 1.5 mmol/L (ref 0.5–2.2)

## 2014-06-01 NOTE — ED Notes (Signed)
Patient transported to CT 

## 2014-06-01 NOTE — ED Notes (Signed)
Discharge instructions given to patient and daughter.  Voiced understanding.

## 2014-06-01 NOTE — ED Notes (Signed)
Dr Radford PaxBeaton in to talk with patient

## 2014-06-01 NOTE — ED Provider Notes (Signed)
CSN: [id number]     Arrival date & time 06/01/14  1741 History   First MD Initiated Contact with Patient 06/01/14 1750     Chief Complaint  Patient presents with  . Abdominal Pain     HPI Patient presents with some left lower quadrant pain today.  Said 3 episodes lasting 5-10 minutes each.  Denies fever chills nausea vomiting or diarrhea.  Pain is gone at present time.  Patient has a known aneurysm in his abdomen which according to him and the daughter is large enough to be operated on.  They went to a surgeon in [place] proximally 3 years ago and elected that they did not want surgery.  Patient has survived longer than the surgeon anticipated.  Patient is asymptomatic the present time. Past Medical History  Diagnosis Date  . HLD (hyperlipidemia)   . Abnormal EKG     left ventricular hypertrophy and secondary repolarization changes   . HTN (hypertension)     boarderline control   . Thoracic aortic aneurysm   . CAD in native artery     with NSTEMI and stent to circumflex coronary artery 3/11. nml left ventricular function   . PVD (peripheral vascular disease)     with thoracic abd aneurysm; follwed by Dr. Earley Brooke. signif. enlarged measuring 6 cm  . Dyslipidemia   . Abdominal aortic aneurysm     Followed by vascular surgery  . Myocardial infarction 2007  . Macular degeneration   . Spinal stenosis     pt.s daughter states about 2 weeks ago 10-09-2011 had "cortisone injection  in his back"   Past Surgical History  Procedure Laterality Date  . Appendectomy    . Removal of bone chip  1995    right arm   . Appendectomy    . Right arm nerve transfer  1989  . Cataract extraction  1998 &2004  . Joint replacement  2009    Hip  . Enucleation  2012    Left eye removal   Family History  Problem Relation Age of Onset  . Cancer      fhx  . Stroke      fhx  . Coronary artery disease      fhx   . Heart disease Mother     angina  . Cancer Father     leukemia  . Heart disease  Father    History  Substance Use Topics  . Smoking status: Former Smoker -- 1.00 packs/day for 35 years    Types: Cigarettes    Start date: 07/02/1931    Quit date: 08/07/1967  . Smokeless tobacco: Never Used  . Alcohol Use: No    Review of Systems  All other systems reviewed and are negative  Allergies  Ciprofloxacin  Home Medications   Prior to Admission medications   Medication Sig Start Date End Date Taking? Authorizing Provider  albuterol (PROVENTIL HFA;VENTOLIN HFA) 108 (90 BASE) MCG/ACT inhaler Inhale 2 puffs into the lungs every 6 (six) hours as needed for wheezing or shortness of breath.    Yes Historical Provider, MD  amLODipine (NORVASC) 2.5 MG tablet Take 1 tablet (2.5 mg total) by mouth 2 (two) times daily. 04/20/14  Yes Laqueta Linden, MD  aspirin 81 MG EC tablet Take 81 mg by mouth daily.     Yes Historical Provider, MD  atorvastatin (LIPITOR) 10 MG tablet Take 1 tablet by mouth daily. 03/07/14  Yes Historical Provider, MD  clopidogrel (PLAVIX) 75 MG tablet Take  1 tablet (75 mg total) by mouth daily. 04/14/14  Yes Laqueta LindenSuresh A Koneswaran, MD  Fluticasone-Salmeterol (ADVAIR DISKUS) 250-50 MCG/DOSE AEPB Inhale 1 puff into the lungs 2 (two) times daily. 01/01/14  Yes Historical Provider, MD  Folic Acid-Vit B6-Vit B12 (FOLBEE) 2.5-25-1 MG TABS Take 1 tablet by mouth daily.     Yes Historical Provider, MD  gabapentin (NEURONTIN) 300 MG capsule Take 1 capsule by mouth daily. 01/26/14  Yes Historical Provider, MD  levothyroxine (SYNTHROID, LEVOTHROID) 50 MCG tablet Take 50 mcg by mouth daily.     Yes Historical Provider, MD  loratadine (CLARITIN) 10 MG tablet Take 10 mg by mouth daily.     Yes Historical Provider, MD  meclizine (ANTIVERT) 12.5 MG tablet Take 12.5 mg by mouth daily.  03/24/13  Yes Historical Provider, MD  Multiple Vitamin (MULTIVITAMIN) tablet Take 1 tablet by mouth daily.     Yes Historical Provider, MD  Multiple Vitamins-Minerals (MH MACULAR HEALTH PO) Take 1  capsule by mouth 2 (two) times daily.   Yes Historical Provider, MD  nitroGLYCERIN (NITROSTAT) 0.4 MG SL tablet Place 0.4 mg under the tongue every 5 (five) minutes as needed.     Yes Historical Provider, MD  prednisoLONE acetate (PRED MILD) 0.12 % ophthalmic suspension Place 1 drop into the right eye 3 (three) times daily.    Yes Historical Provider, MD  Specialty Vitamins Products (PROSTATE) TABS Take 1 tablet by mouth daily.    Yes Historical Provider, MD   BP 141/78  Pulse 65  Temp(Src) 98 F (36.7 C) (Oral)  Resp 17  SpO2 93% Physical Exam Physical Exam  Nursing note and vitals reviewed. Constitutional: He is oriented to person, place, and time. He appears well-developed and well-nourished. No distress.  HENT:  Head: Normocephalic and atraumatic.  Eyes: Pupils are equal, round, and reactive to light.  Neck: Normal range of motion.  Cardiovascular: Normal rate and intact distal pulses.   Pulmonary/Chest: No respiratory distress.  Abdominal: Normal appearance. He exhibits no distension.  no rebound or guarding tenderness.  No definite masses noted.  Active bowel sounds. Musculoskeletal: Normal range of motion.  Neurological: He is alert and oriented to person, place, and time. No cranial nerve deficit.  Skin: Skin is warm and dry. No rash noted.    ED Course  Procedures (including critical care time) Labs Review Labs Reviewed  COMPREHENSIVE METABOLIC PANEL - Abnormal; Notable for the following:    Glucose, Bld 120 (*)    BUN 24 (*)    Creatinine, Ser 1.68 (*)    GFR calc non Af Amer 34 (*)    GFR calc Af Amer 39 (*)    All other components within normal limits  CBC WITH DIFFERENTIAL  LACTIC ACID, PLASMA  LIPASE, BLOOD  URINALYSIS, ROUTINE W REFLEX MICROSCOPIC    Imaging Review Ct Renal Stone Study  06/01/2014   CLINICAL DATA:  Acute left lower quadrant pain. Abdominal aortic aneurysm.  EXAM: CT ABDOMEN AND PELVIS WITHOUT CONTRAST  TECHNIQUE: Multidetector CT imaging  of the abdomen and pelvis was performed following the standard protocol without IV contrast.  COMPARISON:  10/24/11  FINDINGS: Lower chest:  Unremarkable.  Hepatobiliary: No mass or other abnormality visualized on this non-contrast exam.  Pancreas: No mass or inflammatory process visualized on this non-contrast exam.  Spleen:  Within normal limits in size.  Adrenal Glands:  No mass identified.  Kidneys/Urinary tract: No evidence of urolithiasis or hydronephrosis.  Stomach/Bowel/Peritoneum:  Unremarkable.  Vascular/Lymphatic: No pathologically enlarged  lymph nodes identified. Fusiform infrarenal abdominal aortic aneurysm is seen which measures 7.8 x 7.3 cm, which is increased in size from 6.6 x 6.3 cm previously. No evidence of aneurysm leak or rupture.  Reproductive:  No mass or other significant abnormality noted.  Other: Bilateral hip prostheses result in beam hardening artifact through the lower pelvis.  Musculoskeletal:  No suspicious bone lesions identified.  IMPRESSION: Increased size of infrarenal abdominal aortic aneurysm, now measuring 7.8 cm in maximum diameter. No evidence of aneurysm rupture or other acute findings. Vascular surgery consultation recommended due to increased risk of rupture for AAA >5.5 cm. This recommendation follows ACR consensus guidelines: White Paper of the ACR Incidental Findings Committee II on Vascular Findings. J Am Coll Radiol 2013; 10:789-794.   Electronically Signed   By: Myles RosenthalJohn  Stahl M.D.   On: 06/01/2014 19:07     EKG Interpretation None     Patient just wanted to make sure they didn't have a kidney or bowel infection.  He does not want anything done in case it is an aneurysm.  I told him I couldn't be certain that it wasn't early symptoms of leaking aneurysm but that the CT scan showed no leak at the present time.  He understands that.  He was invited to return to emergency room for any other reason.  His discharge in stable condition. MDM   Final diagnoses:  LLQ  abdominal pain        Nelia Shiobert L Sahian Kerney, MD 06/01/14 2125

## 2014-06-01 NOTE — ED Notes (Signed)
Per  EMS patient starting having intermitted Sharp pain in the Lower ABD. Patient denies n/v. EMS states patient is on a blood thinner. Last BP 158/78.

## 2014-06-01 NOTE — Discharge Instructions (Signed)
Abdominal Aortic Aneurysm  Blood pumps away from the heart through tubes (blood vessels) called arteries. Aneurysms are weak or damaged places in the wall of an artery. It bulges out like a balloon. An abdominal aortic aneurysm happens in the main artery of the body (aorta). It can burst or tear, causing bleeding inside the body. This is an emergency. It needs treatment right away. CAUSES  The exact cause is unknown. Things that could cause this problem include:  Fat and other substances building up in the lining of a tube.  Swelling of the walls of a blood vessel.  Certain tissue diseases.  Belly (abdominal) trauma.  An infection in the main artery of the body. RISK FACTORS There are things that make it more likely for you to have an aneurysm. These include:  Being over the age of 78 years old.  Having high blood pressure (hypertension).  Being a male.  Being white.  Being very overweight (obese).  Having a family history of aneurysm.  Using tobacco products. PREVENTION To lessen your chance of getting this condition:  Stop smoking. Stop chewing tobacco.  Limit or avoid alcohol.  Keep your blood pressure, blood sugar, and cholesterol within normal limits.  Eat less salt.  Eat foods low in saturated fats and cholesterol. These are found in animal and whole dairy products.  Eat more fiber. Fiber is found in whole grains, vegetables, and fruits.  Keep a healthy weight.  Stay active and exercise often. SYMPTOMS Symptoms depend on the size of the aneurysm and how fast it grows. There may not be symptoms. If symptoms occur, they can include:  Pain (belly, side, lower back, or groin).  Feeling full after eating a small amount of food.  Feeling sick to your stomach (nauseous), throwing up (vomiting), or both.  Feeling a lump in your belly that feels like it is beating (pulsating).  Feeling like you will pass out (faint). TREATMENT   Medicine to control blood  pressure and pain.  Imaging tests to see if the aneurysm gets bigger.  Surgery. MAKE SURE YOU:   Understand these instructions.  Will watch your condition.  Will get help right away if you are not doing well or get worse. Document Released: 11/17/2012 Document Reviewed: 11/17/2012 [facility] Patient Information 2015 [place], [place]. This information is not intended to replace advice given to you by your health care provider. Make sure you discuss any questions you have with your health care provider.  Abdominal Pain Many things can cause abdominal pain. Usually, abdominal pain is not caused by a disease and will improve without treatment. It can often be observed and treated at home. Your health care provider will do a physical exam and possibly order blood tests and X-rays to help determine the seriousness of your pain. However, in many cases, more time must pass before a clear cause of the pain can be found. Before that point, your health care provider may not know if you need more testing or further treatment. HOME CARE INSTRUCTIONS  Monitor your abdominal pain for any changes. The following actions may help to alleviate any discomfort you are experiencing:  Only take over-the-counter or prescription medicines as directed by your health care provider.  Do not take laxatives unless directed to do so by your health care provider.  Try a clear liquid diet (broth, tea, or water) as directed by your health care provider. Slowly move to a bland diet as tolerated. SEEK MEDICAL CARE IF:  You have unexplained  abdominal pain.  You have abdominal pain associated with nausea or diarrhea.  You have pain when you urinate or have a bowel movement.  You experience abdominal pain that wakes you in the night.  You have abdominal pain that is worsened or improved by eating food.  You have abdominal pain that is worsened with eating fatty foods.  You have a fever. SEEK IMMEDIATE MEDICAL CARE IF:    Your pain does not go away within 2 hours.  You keep throwing up (vomiting).  Your pain is felt only in portions of the abdomen, such as the right side or the left lower portion of the abdomen.  You pass bloody or black tarry stools. MAKE SURE YOU:  Understand these instructions.   Will watch your condition.   Will get help right away if you are not doing well or get worse.  Document Released: 05/02/2005 Document Revised: 07/28/2013 Document Reviewed: 04/01/2013 [facility] Patient Information 2015 [place], [place]. This information is not intended to replace advice given to you by your health care provider. Make sure you discuss any questions you have with your health care provider.

## 2014-06-01 NOTE — ED Notes (Signed)
Patient states he is not hurting at this time.  Resting with daughter at the bedside.

## 2014-06-01 NOTE — ED Notes (Signed)
Silver tone watch found in the bed laying on the patients lap.  Stood up on the side of the bed to use the urinal without difficulty.

## 2014-08-19 ENCOUNTER — Emergency Department (HOSPITAL_COMMUNITY): Payer: Medicare Other

## 2014-08-19 ENCOUNTER — Observation Stay (HOSPITAL_COMMUNITY): Payer: Medicare Other

## 2014-08-19 ENCOUNTER — Encounter (HOSPITAL_COMMUNITY): Payer: Self-pay | Admitting: Emergency Medicine

## 2014-08-19 ENCOUNTER — Observation Stay (HOSPITAL_COMMUNITY)
Admission: EM | Admit: 2014-08-19 | Discharge: 2014-08-21 | Disposition: A | Discharge status: Home / Self Care | Payer: Medicare Other | Attending: Internal Medicine | Admitting: Internal Medicine

## 2014-08-19 DIAGNOSIS — Z7951 Long term (current) use of inhaled steroids: Secondary | ICD-10-CM | POA: Insufficient documentation

## 2014-08-19 DIAGNOSIS — I2510 Atherosclerotic heart disease of native coronary artery without angina pectoris: Secondary | ICD-10-CM | POA: Diagnosis not present

## 2014-08-19 DIAGNOSIS — E038 Other specified hypothyroidism: Secondary | ICD-10-CM

## 2014-08-19 DIAGNOSIS — I714 Abdominal aortic aneurysm, without rupture, unspecified: Secondary | ICD-10-CM | POA: Diagnosis present

## 2014-08-19 DIAGNOSIS — N179 Acute kidney failure, unspecified: Secondary | ICD-10-CM | POA: Insufficient documentation

## 2014-08-19 DIAGNOSIS — I716 Thoracoabdominal aortic aneurysm, without rupture, unspecified: Secondary | ICD-10-CM | POA: Diagnosis present

## 2014-08-19 DIAGNOSIS — H9190 Unspecified hearing loss, unspecified ear: Secondary | ICD-10-CM | POA: Insufficient documentation

## 2014-08-19 DIAGNOSIS — Z7982 Long term (current) use of aspirin: Secondary | ICD-10-CM | POA: Diagnosis not present

## 2014-08-19 DIAGNOSIS — N183 Chronic kidney disease, stage 3 (moderate): Secondary | ICD-10-CM | POA: Diagnosis not present

## 2014-08-19 DIAGNOSIS — Z7902 Long term (current) use of antithrombotics/antiplatelets: Secondary | ICD-10-CM | POA: Diagnosis not present

## 2014-08-19 DIAGNOSIS — E039 Hypothyroidism, unspecified: Secondary | ICD-10-CM | POA: Diagnosis not present

## 2014-08-19 DIAGNOSIS — N400 Enlarged prostate without lower urinary tract symptoms: Secondary | ICD-10-CM | POA: Diagnosis not present

## 2014-08-19 DIAGNOSIS — H3530 Unspecified macular degeneration: Secondary | ICD-10-CM | POA: Insufficient documentation

## 2014-08-19 DIAGNOSIS — Z79899 Other long term (current) drug therapy: Secondary | ICD-10-CM | POA: Insufficient documentation

## 2014-08-19 DIAGNOSIS — I129 Hypertensive chronic kidney disease with stage 1 through stage 4 chronic kidney disease, or unspecified chronic kidney disease: Secondary | ICD-10-CM | POA: Diagnosis not present

## 2014-08-19 DIAGNOSIS — R079 Chest pain, unspecified: Secondary | ICD-10-CM | POA: Diagnosis present

## 2014-08-19 DIAGNOSIS — Z9181 History of falling: Secondary | ICD-10-CM | POA: Insufficient documentation

## 2014-08-19 DIAGNOSIS — I10 Essential (primary) hypertension: Secondary | ICD-10-CM | POA: Diagnosis present

## 2014-08-19 DIAGNOSIS — Z955 Presence of coronary angioplasty implant and graft: Secondary | ICD-10-CM | POA: Insufficient documentation

## 2014-08-19 DIAGNOSIS — I252 Old myocardial infarction: Secondary | ICD-10-CM | POA: Insufficient documentation

## 2014-08-19 DIAGNOSIS — Z87891 Personal history of nicotine dependence: Secondary | ICD-10-CM | POA: Diagnosis not present

## 2014-08-19 DIAGNOSIS — Z66 Do not resuscitate: Secondary | ICD-10-CM | POA: Insufficient documentation

## 2014-08-19 DIAGNOSIS — E785 Hyperlipidemia, unspecified: Secondary | ICD-10-CM | POA: Diagnosis not present

## 2014-08-19 DIAGNOSIS — R531 Weakness: Secondary | ICD-10-CM | POA: Diagnosis not present

## 2014-08-19 DIAGNOSIS — Z951 Presence of aortocoronary bypass graft: Secondary | ICD-10-CM | POA: Insufficient documentation

## 2014-08-19 DIAGNOSIS — I739 Peripheral vascular disease, unspecified: Secondary | ICD-10-CM | POA: Diagnosis not present

## 2014-08-19 DIAGNOSIS — Z7952 Long term (current) use of systemic steroids: Secondary | ICD-10-CM | POA: Diagnosis not present

## 2014-08-19 LAB — CBC
HCT: 35.1 % — ABNORMAL LOW (ref 39.0–52.0)
Hemoglobin: 11.9 g/dL — ABNORMAL LOW (ref 13.0–17.0)
MCH: 31.5 pg (ref 26.0–34.0)
MCHC: 33.9 g/dL (ref 30.0–36.0)
MCV: 92.9 fL (ref 78.0–100.0)
Platelets: 156 10*3/uL (ref 150–400)
RBC: 3.78 MIL/uL — ABNORMAL LOW (ref 4.22–5.81)
RDW: 14.3 % (ref 11.5–15.5)
WBC: 7.4 10*3/uL (ref 4.0–10.5)

## 2014-08-19 LAB — COMPREHENSIVE METABOLIC PANEL
ALBUMIN: 3.6 g/dL (ref 3.5–5.2)
ALK PHOS: 89 U/L (ref 39–117)
ALT: 19 U/L (ref 0–53)
AST: 37 U/L (ref 0–37)
Anion gap: 10 (ref 5–15)
BILIRUBIN TOTAL: 0.6 mg/dL (ref 0.3–1.2)
BUN: 29 mg/dL — AB (ref 6–23)
CO2: 22 mmol/L (ref 19–32)
Calcium: 9.4 mg/dL (ref 8.4–10.5)
Chloride: 108 mEq/L (ref 96–112)
Creatinine, Ser: 2.09 mg/dL — ABNORMAL HIGH (ref 0.50–1.35)
GFR calc Af Amer: 30 mL/min — ABNORMAL LOW (ref >90)
GFR calc non Af Amer: 26 mL/min — ABNORMAL LOW (ref >90)
Glucose, Bld: 113 mg/dL — ABNORMAL HIGH (ref 70–99)
Potassium: 4.4 mmol/L (ref 3.5–5.1)
SODIUM: 140 mmol/L (ref 135–145)
TOTAL PROTEIN: 7.1 g/dL (ref 6.0–8.3)

## 2014-08-19 LAB — PROTIME-INR
INR: 1.11 (ref 0.00–1.49)
PROTHROMBIN TIME: 14.4 s (ref 11.6–15.2)

## 2014-08-19 LAB — I-STAT TROPONIN, ED: TROPONIN I, POC: 0.01 ng/mL (ref 0.00–0.08)

## 2014-08-19 LAB — APTT: aPTT: 29 seconds (ref 24–37)

## 2014-08-19 MED ORDER — MOMETASONE FURO-FORMOTEROL FUM 100-5 MCG/ACT IN AERO
2.0000 | INHALATION_SPRAY | Freq: Two times a day (BID) | RESPIRATORY_TRACT | Status: DC
  Administered 2014-08-19 – 2014-08-21 (×3): 2 via RESPIRATORY_TRACT
  Filled 2014-08-19: qty 8.8

## 2014-08-19 MED ORDER — AMLODIPINE BESYLATE 2.5 MG PO TABS
2.5000 mg | ORAL_TABLET | Freq: Two times a day (BID) | ORAL | Status: DC
  Administered 2014-08-19: 2.5 mg via ORAL
  Filled 2014-08-19 (×3): qty 1

## 2014-08-19 MED ORDER — MORPHINE SULFATE 2 MG/ML IJ SOLN: 1.0000 mg | INTRAMUSCULAR | Status: DC | PRN

## 2014-08-19 MED ORDER — MECLIZINE HCL 12.5 MG PO TABS
12.5000 mg | ORAL_TABLET | Freq: Every day | ORAL | Status: DC
  Administered 2014-08-20 – 2014-08-21 (×2): 12.5 mg via ORAL
  Filled 2014-08-19 (×2): qty 1

## 2014-08-19 MED ORDER — ADULT MULTIVITAMIN W/MINERALS CH
1.0000 | ORAL_TABLET | Freq: Every day | ORAL | Status: DC
  Administered 2014-08-20 – 2014-08-21 (×2): 1 via ORAL
  Filled 2014-08-19 (×2): qty 1

## 2014-08-19 MED ORDER — CLOPIDOGREL BISULFATE 75 MG PO TABS
75.0000 mg | ORAL_TABLET | Freq: Every day | ORAL | Status: DC
  Administered 2014-08-20 – 2014-08-21 (×2): 75 mg via ORAL
  Filled 2014-08-19 (×2): qty 1

## 2014-08-19 MED ORDER — MH MACULAR HEALTH PO MISC: 1.0000 | Freq: Two times a day (BID) | ORAL | Status: DC

## 2014-08-19 MED ORDER — LEVOTHYROXINE SODIUM 50 MCG PO TABS
50.0000 ug | ORAL_TABLET | Freq: Every day | ORAL | Status: DC
  Administered 2014-08-20 – 2014-08-21 (×2): 50 ug via ORAL
  Filled 2014-08-19 (×3): qty 1

## 2014-08-19 MED ORDER — PROSIGHT PO TABS
1.0000 | ORAL_TABLET | Freq: Two times a day (BID) | ORAL | Status: DC
  Administered 2014-08-19 – 2014-08-21 (×4): 1 via ORAL
  Filled 2014-08-19 (×5): qty 1

## 2014-08-19 MED ORDER — SODIUM CHLORIDE 0.9 % IV SOLN
INTRAVENOUS | Status: DC
  Administered 2014-08-19: 23:00:00 via INTRAVENOUS

## 2014-08-19 MED ORDER — ACETAMINOPHEN 650 MG RE SUPP: 650.0000 mg | Freq: Four times a day (QID) | RECTAL | Status: DC | PRN

## 2014-08-19 MED ORDER — SODIUM CHLORIDE 0.9 % IJ SOLN
3.0000 mL | Freq: Two times a day (BID) | INTRAMUSCULAR | Status: DC
  Administered 2014-08-20 – 2014-08-21 (×2): 3 mL via INTRAVENOUS

## 2014-08-19 MED ORDER — ASPIRIN EC 81 MG PO TBEC
81.0000 mg | DELAYED_RELEASE_TABLET | Freq: Every day | ORAL | Status: DC
  Administered 2014-08-20 – 2014-08-21 (×2): 81 mg via ORAL
  Filled 2014-08-19 (×2): qty 1

## 2014-08-19 MED ORDER — ATORVASTATIN CALCIUM 10 MG PO TABS
10.0000 mg | ORAL_TABLET | Freq: Every day | ORAL | Status: DC
Start: 2014-08-20 — End: 2014-08-21
  Administered 2014-08-20 – 2014-08-21 (×2): 10 mg via ORAL
  Filled 2014-08-19 (×2): qty 1

## 2014-08-19 MED ORDER — ALBUTEROL SULFATE HFA 108 (90 BASE) MCG/ACT IN AERS: 2.0000 | INHALATION_SPRAY | Freq: Four times a day (QID) | RESPIRATORY_TRACT | Status: DC | PRN

## 2014-08-19 MED ORDER — GABAPENTIN 300 MG PO CAPS
300.0000 mg | ORAL_CAPSULE | Freq: Every day | ORAL | Status: DC
  Administered 2014-08-20 – 2014-08-21 (×2): 300 mg via ORAL
  Filled 2014-08-19 (×2): qty 1

## 2014-08-19 MED ORDER — ACETAMINOPHEN 325 MG PO TABS: 650.0000 mg | ORAL_TABLET | Freq: Four times a day (QID) | ORAL | Status: DC | PRN

## 2014-08-19 MED ORDER — FOLIC ACID-VIT B6-VIT B12 2.5-25-1 MG PO TABS
1.0000 | ORAL_TABLET | Freq: Every day | ORAL | Status: DC
  Administered 2014-08-20 – 2014-08-21 (×2): 1 via ORAL
  Filled 2014-08-19 (×2): qty 1

## 2014-08-19 MED ORDER — PREDNISOLONE ACETATE 1 % OP SUSP
1.0000 [drp] | Freq: Three times a day (TID) | OPHTHALMIC | Status: DC
  Administered 2014-08-19 – 2014-08-21 (×4): 1 [drp] via OPHTHALMIC
  Filled 2014-08-19: qty 1

## 2014-08-19 MED ORDER — LORATADINE 10 MG PO TABS
10.0000 mg | ORAL_TABLET | Freq: Every day | ORAL | Status: DC
  Administered 2014-08-20 – 2014-08-21 (×2): 10 mg via ORAL
  Filled 2014-08-19 (×2): qty 1

## 2014-08-19 MED ORDER — PROSTATE PO TABS: 1.0000 | ORAL_TABLET | Freq: Every day | ORAL | Status: DC

## 2014-08-19 MED ORDER — ONDANSETRON HCL 4 MG/2ML IJ SOLN: 4.0000 mg | Freq: Three times a day (TID) | INTRAMUSCULAR | Status: DC | PRN

## 2014-08-19 MED ORDER — ALBUTEROL SULFATE (2.5 MG/3ML) 0.083% IN NEBU
2.5000 mg | INHALATION_SOLUTION | Freq: Four times a day (QID) | RESPIRATORY_TRACT | Status: DC | PRN
Start: 2014-08-19 — End: 2014-08-21

## 2014-08-19 MED ORDER — NITROGLYCERIN 0.4 MG SL SUBL: 0.4000 mg | SUBLINGUAL_TABLET | SUBLINGUAL | Status: DC | PRN

## 2014-08-19 MED ORDER — HEPARIN SODIUM (PORCINE) 5000 UNIT/ML IJ SOLN
5000.0000 [IU] | Freq: Three times a day (TID) | INTRAMUSCULAR | Status: DC
  Administered 2014-08-19 – 2014-08-21 (×5): 5000 [IU] via SUBCUTANEOUS
  Filled 2014-08-19 (×9): qty 1

## 2014-08-19 NOTE — Consult Note (Signed)
CARDIOLOGY CONSULT NOTE  Assessment and Plan:  *Chest pain:  [name] [name] is a pleasant 81 year old male with history of CAD status post PCI to left circumflex, peripheral vascular disease with a thoracoabdominal aneurysm, hypertension, dyslipidemia comes to the emergency department with acute onset left-sided chest pain that resolved with 4 doses of baby aspirin and 2 sublingual nitroglycerin. There are certain features of his presentation suggestive of fungal angina. His EKG shows biphasic T waves in the inferolateral anterior leads which are unchanged. His first set of troponin is normal. Due to his previous history, patient is clearly at risk for having acute coronary syndrome. For now, recommend overnight admission for acute coronary syndrome rule out.  We recommend: -- Continue aspirin 81 mg daily -- Continue atorvastatin 10 mg daily -- Okay to start heparin drip if troponins returned elevated -- Monitor on telemetry. Cycle cardiac enzymes 3. -- Keep nothing by mouth after midnight for possible noninvasive ischemic evaluation. Would have very high threshold to consider left heart catheterization due to elevated creatinine. -- Please get a transthoracic echocardiogram in the morning. -- Nitroglycerin when necessary chest pain. -- Recommend IV hydration with normal saline 75 mL an hour for 12 hours.  Thank you very much for this very interesting consult. We'll continue to follow along. Please do not hesitate to call the on-call cardiology M.D. with any questions.   Primary cardiologist: [facility] Chief complaint: Chest pain  HPI: peripheral vascular disease  [name] [name] is a 81 year old male with history of CAD status post PCI to ?LCX, peripheral vascular disease thoracoabdominal aneurysm, hypertension, dyslipidemia comes to the emergency department with acute onset left-sided chest pain. [name] [name] was in his usual state of health until about 3 PM today  when he woke up with a left-sided chest pressure/pain which she grade 6 out of 10. He denies any radiation or any other associated symptoms of chest diaphoresis, shortness of breath, palpitations. He feels that these episodes are very similar to his previous episodes that require further cardiac workup. He immediately after the symptoms, he took 4 doses of baby aspirin followed by 2 sublingual nitroglycerin with some improvement in his pain. Overall the symptoms lasted for approximately an hour and resolved by the time he arrived to the emergency department. Over past few days, patient denies any symptoms suggestive of unstable angina or decompensated heart failure.  Of note, daughter states that patient usually does not have lunch but today had a veterans meeting around 12:00 when he had a large lunch. Patient does not feel that his symptoms were suggestive of gastritis or acid reflux.  Previous cardiac imaging  EKG on 08/19/2014: Normal sinus rhythm with first-degree AV block and biphasic T wave inversions in the anterolateral and inferior leads. Unchanged from 04/14/13  TTE: None  NM Stress test: None  Past Medical History Past Medical History  Diagnosis Date  . HLD (hyperlipidemia)   . Abnormal EKG     left ventricular hypertrophy and secondary repolarization changes   . HTN (hypertension)     boarderline control   . Thoracic aortic aneurysm   . CAD in native artery     with NSTEMI and stent to circumflex coronary artery 3/11. nml left ventricular function   . PVD (peripheral vascular disease)     with thoracic abd aneurysm; follwed by Dr. Earley Brooke. signif. enlarged measuring 6 cm  . Dyslipidemia   . Abdominal aortic aneurysm     Followed by vascular surgery  .  Myocardial infarction 2007  . Macular degeneration   . Spinal stenosis     pt.s daughter states about 2 weeks ago 10-09-2011 had "cortisone injection  in his back"   Allergies: Allergies  Allergen Reactions  .  Ciprofloxacin     REACTION: swelling of joints,mouth   Social History History   Social History  . Marital Status: Married    Spouse Name: N/A    Number of Children: N/A  . Years of Education: N/A   Occupational History  . Not on file.   Social History Main Topics  . Smoking status: Former Smoker -- 1.00 packs/day for 35 years    Types: Cigarettes    Start date: 07/02/1931    Quit date: 08/07/1967  . Smokeless tobacco: Never Used  . Alcohol Use: No  . Drug Use: No  . Sexual Activity: Not on file   Other Topics Concern  . 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 Cancer      fhx  . Stroke      fhx  . Coronary artery disease      fhx   . Heart disease Mother     angina  . Cancer Father     leukemia  . Heart disease Father    Medications: No current facility-administered medications for this encounter.   Current Outpatient Prescriptions  Medication Sig Dispense Refill  . albuterol (PROVENTIL HFA;VENTOLIN HFA) 108 (90 BASE) MCG/ACT inhaler Inhale 2 puffs into the lungs every 6 (six) hours as needed for wheezing or shortness of breath.     [name]. amLODipine (NORVASC) 2.5 MG tablet Take 1 tablet (2.5 mg total) by mouth 2 (two) times daily. 180 tablet 3  . aspirin 81 MG EC tablet Take 81 mg by mouth daily.      [name]. atorvastatin (LIPITOR) 10 MG tablet Take 1 tablet by mouth daily.    . clopidogrel (PLAVIX) 75 MG tablet Take 1 tablet (75 mg total) by mouth daily. 90 tablet 3  . Fluticasone-Salmeterol (ADVAIR DISKUS) 250-50 MCG/DOSE AEPB Inhale 1 puff into the lungs 2 (two) times daily.    . Folic Acid-Vit B6-Vit B12 (FOLBEE) 2.5-25-1 MG TABS Take 1 tablet by mouth daily.      [name]. gabapentin (NEURONTIN) 300 MG capsule Take 1 capsule by mouth daily.    [name]. levothyroxine (SYNTHROID, LEVOTHROID) 50 MCG tablet Take 50 mcg by mouth daily.      [name]. loratadine (CLARITIN) 10 MG tablet Take 10 mg by  mouth daily.      . meclizine (ANTIVERT) 12.5 MG tablet Take 12.5 mg by mouth daily.     . Multiple Vitamin (MULTIVITAMIN) tablet Take 1 tablet by mouth daily.      . Multiple Vitamins-Minerals (MH MACULAR HEALTH PO) Take 1 capsule by mouth 2 (two) times daily.    . nitroGLYCERIN (NITROSTAT) 0.4 MG SL tablet Place 0.4 mg under the tongue every 5 (five) minutes as needed.      . prednisoLONE acetate (PRED MILD) 0.12 % ophthalmic suspension Place 1 drop into the right eye 3 (three) times daily.     [name]. Specialty Vitamins Products (PROSTATE) TABS Take 1 tablet by mouth daily.       Physical Exam Filed Vitals:   08/19/14 1945  BP: 148/75  Pulse: 62  Temp:   Resp: 13    Gen: Comfortable appearing appears  much younger than his stated age  HEENT: Moist because membranes, one eye Neck: O, without JVD, without carotid bruits CV: Regular rate rhythm, normal S1, normal S2, 2/6 midsystolic murmur best heard at the right upper sternal border. +2 pulses in bilateral upper extremities Pulm: Clear to auscultation bilaterally anteriorly Abdomen: Soft, nontender, nondistended, no left upper quadrant tenderness with deep palpation, normal bowel sounds  Ext: No cyanosis clubbing or edema  Labs:  Results for orders placed or performed during the hospital encounter of 08/19/14 (from the past 24 hour(s))  APTT     Status: None   Collection Time: 08/19/14  7:11 PM  Result Value Ref Range   aPTT 29 24 - 37 seconds  CBC     Status: Abnormal   Collection Time: 08/19/14  7:11 PM  Result Value Ref Range   WBC 7.4 4.0 - 10.5 K/uL   RBC 3.78 (L) 4.22 - 5.81 MIL/uL   Hemoglobin 11.9 (L) 13.0 - 17.0 g/dL   HCT 16.1 (L) 09.6 - 04.5 %   MCV 92.9 78.0 - 100.0 fL   MCH 31.5 26.0 - 34.0 pg   MCHC 33.9 30.0 - 36.0 g/dL   RDW 40.9 81.1 - 91.4 %   Platelets 156 150 - 400 K/uL  Comprehensive metabolic panel     Status: Abnormal   Collection Time: 08/19/14  7:11 PM  Result Value Ref Range   Sodium 140 135 - 145  mmol/L   Potassium 4.4 3.5 - 5.1 mmol/L   Chloride 108 96 - 112 mEq/L   CO2 22 19 - 32 mmol/L   Glucose, Bld 113 (H) 70 - 99 mg/dL   BUN 29 (H) 6 - 23 mg/dL   Creatinine, Ser 7.82 (H) 0.50 - 1.35 mg/dL   Calcium 9.4 8.4 - 95.6 mg/dL   Total Protein 7.1 6.0 - 8.3 g/dL   Albumin 3.6 3.5 - 5.2 g/dL   AST 37 0 - 37 U/L   ALT 19 0 - 53 U/L   Alkaline Phosphatase 89 39 - 117 U/L   Total Bilirubin 0.6 0.3 - 1.2 mg/dL   GFR calc non Af Amer 26 (L) >90 mL/min   GFR calc Af Amer 30 (L) >90 mL/min   Anion gap 10 5 - 15  Protime-INR     Status: None   Collection Time: 08/19/14  7:11 PM  Result Value Ref Range   Prothrombin Time 14.4 11.6 - 15.2 seconds   INR 1.11 0.00 - 1.49  I-stat troponin, ED     Status: None   Collection Time: 08/19/14  7:21 PM  Result Value Ref Range   Troponin i, poc 0.01 0.00 - 0.08 ng/mL   Comment 3

## 2014-08-19 NOTE — ED Notes (Signed)
Pt arrives via EMS, states he woke up from a nap with left sided dull chest pain. Pt took 324 mg asa, 2 nitro 12 mins apart, pt reports pain is gone at this time. Pt states pain only occurred when he was lying down. Pt denies sob, alert, oriented, nad.

## 2014-08-19 NOTE — ED Notes (Signed)
MD at bedside. 

## 2014-08-19 NOTE — ED Notes (Signed)
Attempted to call report, nurse unavailable to take report, number give and nurse to call back to obtain

## 2014-08-19 NOTE — H&P (Signed)
[facility] History and Physical  [name] PlanasBoyd J Kistner WUJ:[id number] DOB: 03/11/1920 DOA: 08/19/2014  Referring physician: ED physician PCP: Josue HectorNYLAND,LEONARD ROBERT, MD  Specialists:   Chief Complaint: chest pain  HPI: [name] [name] is a 81 y.o. male with history of CAD (s/p of stent 2011), peripheral vascular disease,  thoracoabdominal aneurysm, hypertension, dyslipidemia, hypothyroidism, carotid artery stenosis, chronic kidney disease-stage III, who presents with chest pain.  Patient reports that he has doing well until about 3 PM today when he woke up with left-sided chest pain. Chest pain is moderate, constant, nonradiating. It is not pleuritic. It is not aggravated by deep breath. Patient does not have fever, chills. He has chronic mild cough, which has not changed. His chest pain is not associated with palpitation or shortness breath. He feels that these episodes are very similar to his previous episodes that require further cardiac workup. He took 4 doses of baby aspirin followed by 2 sublingual nitroglycerin with some improvement in his pain. Overall the symptoms lasted for approximately an hour and resolved by the time he arrived to the emergency department. Patient does not have leg edema. No tenderness over calf area.   Work up in the ED demonstrates T-wave inversion in inferior leads and precordial leads diffusely, which existed in previous EKG on 04/14/13. Troponin negative. INR 1.11. Chest x-ray is negative for acute abnormalities. Slightly worsening of renal function. Patient is admitted to inpatient for further evaluation and treatment.  Review of Systems: As presented in the history of presenting illness, rest negative.  Where does patient live?  At home Can patient participate in ADLs? little  Allergy:  Allergies  Allergen Reactions  . Ciprofloxacin     REACTION: swelling of joints,mouth    Past Medical History  Diagnosis Date  . HLD (hyperlipidemia)   . Abnormal EKG    left ventricular hypertrophy and secondary repolarization changes   . HTN (hypertension)     boarderline control   . Thoracic aortic aneurysm   . CAD in native artery     with NSTEMI and stent to circumflex coronary artery 3/11. nml left ventricular function   . PVD (peripheral vascular disease)     with thoracic abd aneurysm; follwed by Dr. Earley Brookeickonson. signif. enlarged measuring 6 cm  . Dyslipidemia   . Abdominal aortic aneurysm     Followed by vascular surgery  . Myocardial infarction 2007  . Macular degeneration   . Spinal stenosis     pt.s daughter states about 2 weeks ago 10-09-2011 had "cortisone injection  in his back"    Past Surgical History  Procedure Laterality Date  . Appendectomy    . Removal of bone chip  1995    right arm   . Appendectomy    . Right arm nerve transfer  1989  . Cataract extraction  1998 &2004  . Joint replacement  2009    Hip  . Enucleation  2012    Left eye removal    Social History:  reports that he quit smoking about 47 years ago. His smoking use included Cigarettes. He started smoking about 83 years ago. He has a 35 pack-year smoking history. He has never used smokeless tobacco. He reports that he does not drink alcohol or use illicit drugs.  Family History:  Family History  Problem Relation Age of Onset  . Cancer      fhx  . Stroke      fhx  . Coronary artery disease  fhx   . Heart disease Mother     angina  . Cancer Father     leukemia  . Heart disease Father      Prior to Admission medications   Medication Sig Start Date End Date Taking? Authorizing Provider  albuterol (PROVENTIL HFA;VENTOLIN HFA) 108 (90 BASE) MCG/ACT inhaler Inhale 2 puffs into the lungs every 6 (six) hours as needed for wheezing or shortness of breath.     Historical Provider, MD  amLODipine (NORVASC) 2.5 MG tablet Take 1 tablet (2.5 mg total) by mouth 2 (two) times daily. 04/20/14   Laqueta Linden, MD  aspirin 81 MG EC tablet Take 81 mg by mouth  daily.      Historical Provider, MD  atorvastatin (LIPITOR) 10 MG tablet Take 1 tablet by mouth daily. 03/07/14   Historical Provider, MD  clopidogrel (PLAVIX) 75 MG tablet Take 1 tablet (75 mg total) by mouth daily. 04/14/14   Laqueta Linden, MD  Fluticasone-Salmeterol (ADVAIR DISKUS) 250-50 MCG/DOSE AEPB Inhale 1 puff into the lungs 2 (two) times daily. 01/01/14   Historical Provider, MD  Folic Acid-Vit B6-Vit B12 (FOLBEE) 2.5-25-1 MG TABS Take 1 tablet by mouth daily.      Historical Provider, MD  gabapentin (NEURONTIN) 300 MG capsule Take 1 capsule by mouth daily. 01/26/14   Historical Provider, MD  levothyroxine (SYNTHROID, LEVOTHROID) 50 MCG tablet Take 50 mcg by mouth daily.      Historical Provider, MD  loratadine (CLARITIN) 10 MG tablet Take 10 mg by mouth daily.      Historical Provider, MD  meclizine (ANTIVERT) 12.5 MG tablet Take 12.5 mg by mouth daily.  03/24/13   Historical Provider, MD  Multiple Vitamin (MULTIVITAMIN) tablet Take 1 tablet by mouth daily.      Historical Provider, MD  Multiple Vitamins-Minerals (MH MACULAR HEALTH PO) Take 1 capsule by mouth 2 (two) times daily.    Historical Provider, MD  nitroGLYCERIN (NITROSTAT) 0.4 MG SL tablet Place 0.4 mg under the tongue every 5 (five) minutes as needed.      Historical Provider, MD  prednisoLONE acetate (PRED MILD) 0.12 % ophthalmic suspension Place 1 drop into the right eye 3 (three) times daily.     Historical Provider, MD  Specialty Vitamins Products (PROSTATE) TABS Take 1 tablet by mouth daily.     Historical Provider, MD    Physical Exam: Filed Vitals:   08/19/14 2045 08/19/14 2115 08/19/14 2214 08/19/14 2306  BP: 145/76 154/77 164/85   Pulse: 62 64 69 64  Temp:   98 F (36.7 C)   TempSrc:   Oral   Resp: 18 18 18 18   Height:   5\' 6"  (1.676 m)   Weight:   65.5 kg (144 lb 6.4 oz)   SpO2: 98% 98% 94% 94%   General: Not in acute distress HEENT:       Eyes: Left eye blindness       ENT: No discharge from the ears  and nose, no pharynx injection, no tonsillar enlargement.        Neck: No JVD, no bruit, no mass felt. Cardiac: S1/S2, RRR, No murmurs, No gallops or rubs Pulm: Good air movement bilaterally. Clear to auscultation bilaterally. No rales, wheezing, rhonchi or rubs. Abd: Soft, nondistended, nontender, no rebound pain, no organomegaly, BS present Ext: No edema bilaterally. 2+DP/PT pulse bilaterally Musculoskeletal: No joint deformities, erythema, or stiffness, ROM full Skin: No rashes.  Neuro: Alert and oriented X3, cranial nerves II-XII grossly intact,  muscle strength 5/5 in all extremeties, sensation to light touch intact. Brachial reflex 2+ bilaterally. Knee reflex 1+ bilaterally. Negative Babinski's sign. Normal finger to nose test. Psych: Patient is not psychotic, no suicidal or hemocidal ideation.  Labs on Admission:  Basic Metabolic Panel:  Recent Labs Lab 08/19/14 1911  NA 140  K 4.4  CL 108  CO2 22  GLUCOSE 113*  BUN 29*  CREATININE 2.09*  CALCIUM 9.4   Liver Function Tests:  Recent Labs Lab 08/19/14 1911  AST 37  ALT 19  ALKPHOS 89  BILITOT 0.6  PROT 7.1  ALBUMIN 3.6   No results for input(s): LIPASE, AMYLASE in the last 168 hours. No results for input(s): AMMONIA in the last 168 hours. CBC:  Recent Labs Lab 08/19/14 1911  WBC 7.4  HGB 11.9*  HCT 35.1*  MCV 92.9  PLT 156   Cardiac Enzymes: No results for input(s): CKTOTAL, CKMB, CKMBINDEX, TROPONINI in the last 168 hours.  BNP (last 3 results) No results for input(s): PROBNP in the last 8760 hours. CBG: No results for input(s): GLUCAP in the last 168 hours.  Radiological Exams on Admission: Dg Chest Portable 1 View  08/19/2014   CLINICAL DATA:  Left-sided chest pain.  EXAM: PORTABLE CHEST - 1 VIEW  COMPARISON:  CT of the chest on 10/24/2011  FINDINGS: The heart size and mediastinal contours are within normal limits. Both lungs are clear. The visualized skeletal structures are unremarkable.   IMPRESSION: No active disease.   Electronically Signed   By: Rosalie Gums M.D.   On: 08/19/2014 20:13    EKG: Independently reviewed. T-wave inversion in inferior leads and precordial leads which has not changed from previous EKG.  Assessment/Plan Principal Problem:   Chest pain Active Problems:   HLD (hyperlipidemia)   Essential hypertension   CAD, NATIVE VESSEL   Thoraco abdominal aneurysm   PVD (peripheral vascular disease)   Abdominal aortic aneurysm   Hypothyroidism   Chest pain and CAD: s/p of stent. Given patient's significant risk factors, it is important to rule out ACS. Chest x-ray is negative for pneumonia. Cardiology was consulted, Dr. Allena Katz saw patient.  - will admit to tele bed  -  Appreciate Dr. Celene Kras, will follow up recommendations as follows: -- Continue aspirin 81 mg daily -- Continue atorvastatin 10 mg daily -- Okay to start heparin drip if troponins returned elevated -- Monitor on telemetry. Cycle cardiac enzymes 3. -- Keep nothing by mouth after midnight for possible noninvasive ischemic evaluation. Would have very high threshold to consider left heart catheterization due to elevated creatinine. -- get a transthoracic echocardiogram in the morning. -- Nitroglycerin when necessary chest pain. -- Recommend IV hydration with normal saline 75 mL an hour for 12 hours.  - will check A1c, TSH and FLP - Continue aspirin and Plavix  AoCKD-III: Baseline creatinine 1.4-1.6. His creatinine is 2.09 on admission. -Check FeNa -Check ultrasound-renal  -IVF: NS 75cc/h  Hypothyroidism: No TSH on record. Patient is on Synthroid at home -Continue Synthroid -Check TSH  Hypertension: -continue home medications: Amlodipine  Aneurysm: Patient has 6 cm AAA and 4 cm thoracic aortic aneurysm. Patient choses to not have invasive surgery per his daughter. Patient has been followed up by a vascular surgeon, Dr. [name] -No acute issues. -Follow-up with vascular  surgeon   DVT ppx: SQ Heparin   Code Status: DNR Family Communication:    Yes, patient's daughter      at bed side Disposition Plan: Admit to  inpatient   Date of Service 08/20/2014    Lorretta Harp [facility] Pager [phone]  If 7PM-7AM, please contact night-coverage [email or web address] Password [email or web address] 08/20/2014, 12:10 AM

## 2014-08-19 NOTE — ED Provider Notes (Signed)
CSN: [id number]     Arrival date & time 08/19/14  1854 History   First MD Initiated Contact with Patient 08/19/14 1902     Chief Complaint  Patient presents with  . Chest Pain     (Consider location/radiation/quality/duration/timing/severity/associated sxs/prior Treatment) HPI Comments: The patient is a 81 year old male with a history of coronary disease status post bare metal stent in 2011 after a non-ST elevation MI. He also has a history of a fusiform infrarenal abdominal aortic aneurysm for which she has refused referral to a major academic center for evaluation of repair. He presents with acute onset of left-sided chest pain when he awoke from a nap 4 hours ago. This was persistent, dull and achy, no radiation and no associated shortness of breath, diaphoresis, nausea vomiting or diarrhea. He has had no swelling in his legs, he was given aspirin and nitroglycerin by the paramedics with complete resolution of his symptoms and at this time he is pain-free.  Patient is a 81 y.o. male presenting with chest pain. The history is provided by the patient, the EMS personnel and medical records.  Chest Pain   Past Medical History  Diagnosis Date  . HLD (hyperlipidemia)   . Abnormal EKG     left ventricular hypertrophy and secondary repolarization changes   . HTN (hypertension)     boarderline control   . Thoracic aortic aneurysm   . CAD in native artery     with NSTEMI and stent to circumflex coronary artery 3/11. nml left ventricular function   . PVD (peripheral vascular disease)     with thoracic abd aneurysm; follwed by Dr. Earley Brookeickonson. signif. enlarged measuring 6 cm  . Dyslipidemia   . Abdominal aortic aneurysm     Followed by vascular surgery  . Myocardial infarction 2007  . Macular degeneration   . Spinal stenosis     pt.s daughter states about 2 weeks ago 10-09-2011 had "cortisone injection  in his back"   Past Surgical History  Procedure Laterality Date  . Appendectomy    .  Removal of bone chip  1995    right arm   . Appendectomy    . Right arm nerve transfer  1989  . Cataract extraction  1998 &2004  . Joint replacement  2009    Hip  . Enucleation  2012    Left eye removal   Family History  Problem Relation Age of Onset  . Cancer      fhx  . Stroke      fhx  . Coronary artery disease      fhx   . Heart disease Mother     angina  . Cancer Father     leukemia  . Heart disease Father    History  Substance Use Topics  . Smoking status: Former Smoker -- 1.00 packs/day for 35 years    Types: Cigarettes    Start date: 07/02/1931    Quit date: 08/07/1967  . Smokeless tobacco: Never Used  . Alcohol Use: No    Review of Systems  Cardiovascular: Positive for chest pain.  All other systems reviewed and are negative.     Allergies  Ciprofloxacin  Home Medications   Prior to Admission medications   Medication Sig Start Date End Date Taking? Authorizing Provider  albuterol (PROVENTIL HFA;VENTOLIN HFA) 108 (90 BASE) MCG/ACT inhaler Inhale 2 puffs into the lungs every 6 (six) hours as needed for wheezing or shortness of breath.     Historical Provider, MD  amLODipine (NORVASC) 2.5 MG tablet Take 1 tablet (2.5 mg total) by mouth 2 (two) times daily. 04/20/14   Laqueta Linden, MD  aspirin 81 MG EC tablet Take 81 mg by mouth daily.      Historical Provider, MD  atorvastatin (LIPITOR) 10 MG tablet Take 1 tablet by mouth daily. 03/07/14   Historical Provider, MD  clopidogrel (PLAVIX) 75 MG tablet Take 1 tablet (75 mg total) by mouth daily. 04/14/14   Laqueta Linden, MD  Fluticasone-Salmeterol (ADVAIR DISKUS) 250-50 MCG/DOSE AEPB Inhale 1 puff into the lungs 2 (two) times daily. 01/01/14   Historical Provider, MD  Folic Acid-Vit B6-Vit B12 (FOLBEE) 2.5-25-1 MG TABS Take 1 tablet by mouth daily.      Historical Provider, MD  gabapentin (NEURONTIN) 300 MG capsule Take 1 capsule by mouth daily. 01/26/14   Historical Provider, MD  levothyroxine  (SYNTHROID, LEVOTHROID) 50 MCG tablet Take 50 mcg by mouth daily.      Historical Provider, MD  loratadine (CLARITIN) 10 MG tablet Take 10 mg by mouth daily.      Historical Provider, MD  meclizine (ANTIVERT) 12.5 MG tablet Take 12.5 mg by mouth daily.  03/24/13   Historical Provider, MD  Multiple Vitamin (MULTIVITAMIN) tablet Take 1 tablet by mouth daily.      Historical Provider, MD  Multiple Vitamins-Minerals (MH MACULAR HEALTH PO) Take 1 capsule by mouth 2 (two) times daily.    Historical Provider, MD  nitroGLYCERIN (NITROSTAT) 0.4 MG SL tablet Place 0.4 mg under the tongue every 5 (five) minutes as needed.      Historical Provider, MD  prednisoLONE acetate (PRED MILD) 0.12 % ophthalmic suspension Place 1 drop into the right eye 3 (three) times daily.     Historical Provider, MD  Specialty Vitamins Products (PROSTATE) TABS Take 1 tablet by mouth daily.     Historical Provider, MD   BP 136/74 mmHg  Pulse 67  Temp(Src) 98.2 F (36.8 C) (Oral)  Resp 18  Ht  (1.676 m)  Wt 144 lb 6.4 oz (65.5 kg)  BMI 23.32 kg/m2  SpO2 95% Physical Exam  Constitutional: He appears well-developed and well-nourished. No distress.  HENT:  Head: Normocephalic and atraumatic.  Mouth/Throat: Oropharynx is clear and moist. No oropharyngeal exudate.  Eyes: Conjunctivae and EOM are normal. Right eye exhibits no discharge. Left eye exhibits no discharge. No scleral icterus.  Left globe absent  Neck: Normal range of motion. Neck supple. No JVD present. No thyromegaly present.  Cardiovascular: Normal rate, regular rhythm, normal heart sounds and intact distal pulses.  Exam reveals no gallop and no friction rub.   No murmur heard. Pulmonary/Chest: Effort normal and breath sounds normal. No respiratory distress. He has no wheezes. He has no rales.  Abdominal: Soft. Bowel sounds are normal. He exhibits no distension and no mass. There is no tenderness.  Musculoskeletal: Normal range of motion. He exhibits no edema  or tenderness.  Lymphadenopathy:    He has no cervical adenopathy.  Neurological: He is alert. Coordination normal.  Skin: Skin is warm and dry. No rash noted. No erythema.  Psychiatric: He has a normal mood and affect. His behavior is normal.  Nursing note and vitals reviewed.   ED Course  Procedures (including critical care time) Labs Review Labs Reviewed  CBC - Abnormal; Notable for the following:    RBC 3.78 (*)    Hemoglobin 11.9 (*)    HCT 35.1 (*)    All other components within normal  limits  COMPREHENSIVE METABOLIC PANEL - Abnormal; Notable for the following:    Glucose, Bld 113 (*)    BUN 29 (*)    Creatinine, Ser 2.09 (*)    GFR calc non Af Amer 26 (*)    GFR calc Af Amer 30 (*)    All other components within normal limits  TROPONIN I - Abnormal; Notable for the following:    Troponin I 0.04 (*)    All other components within normal limits  BASIC METABOLIC PANEL - Abnormal; Notable for the following:    Creatinine, Ser 1.85 (*)    GFR calc non Af Amer 30 (*)    GFR calc Af Amer 35 (*)    All other components within normal limits  CBC - Abnormal; Notable for the following:    RBC 3.84 (*)    Hemoglobin 11.9 (*)    HCT 35.7 (*)    All other components within normal limits  URINALYSIS, ROUTINE W REFLEX MICROSCOPIC - Abnormal; Notable for the following:    Protein, ur 100 (*)    All other components within normal limits  URINE MICROSCOPIC-ADD ON - Abnormal; Notable for the following:    Casts HYALINE CASTS (*)    All other components within normal limits  TROPONIN I - Abnormal; Notable for the following:    Troponin I 0.05 (*)    All other components within normal limits  APTT  PROTIME-INR  TROPONIN I  LIPID PANEL  CREATININE, URINE, RANDOM  SODIUM, URINE, RANDOM  TSH  GLUCOSE, CAPILLARY  TROPONIN I  HEMOGLOBIN A1C  TROPONIN I  I-STAT TROPOININ, ED    Imaging Review US Renal  08/20/2014   CLINICAL DATA:  Acute renal failure.  EXAM: RENAL/URINARY  TRACT ULTRASOUND COMPLETE  COMPARISON:  CT 06/01/2014  FINDINGS: Right Kidney:  Length: 8.4 cm. Mild renal cortical thinning. No hydronephrosis. Small anechoic cyst measuring 12 mm midpole.  Left Kidney:  Length: 8.6 cm.  Moderate cortical thinning.  No hydronephrosis.  Bladder:  The bladder is moderately distended. There is some echogenic material within the bladder.  IMPRESSION: 1. Bilateral renal cortical thinning.  No hydronephrosis. 2. Echogenic debris within the bladder.   Electronically Signed   By: Genevive Bi M.D.   On: 08/20/2014 07:29   Dg Chest Portable 1 View  08/19/2014   CLINICAL DATA:  Left-sided chest pain.  EXAM: PORTABLE CHEST - 1 VIEW  COMPARISON:  CT of the chest on 10/24/2011  FINDINGS: The heart size and mediastinal contours are within normal limits. Both lungs are clear. The visualized skeletal structures are unremarkable.  IMPRESSION: No active disease.   Electronically Signed   By: Rosalie Gums M.D.   On: 08/19/2014 20:13     EKG Interpretation   Date/Time:  Thursday August 19 2014 18:57:15 EST Ventricular Rate:  67 PR Interval:  239 QRS Duration: 107 QT Interval:  493 QTC Calculation: 520 R Axis:   66 Text Interpretation:  Sinus rhythm Prolonged PR interval Abnormal T,  probable ischemia, widespread Prolonged QT interval since last tracing no  significant change multiple ECG's in the past dating back > 8 years with  similar appearance Confirmed by Hyacinth Meeker  MD, Chaunta Bejarano ([id number]) on 08/19/2014  7:03:06 PM      MDM   Final diagnoses:  Chest pain, unspecified chest pain type    The patient has a relatively benign exam, his EKG is grossly abnormal however going back as late as 2007 there is no changes from  the EKGs throughout time. The patient does have significant risk for coronary disease, this could also be related to a thoracic aortic aneurysm that that would be less likely especially as it resolved with medications given, he is artery on Plavix, we'll check  labs, chest x-ray and discussed with cardiology.  I discussed the patient's care with cardiology, they will provide a consultation, requested medical admission.  Troponin negative, EKG unchanged. High risk for recurrent angina.  Meds given in ED:    Vida Roller, MD 08/20/14 [phone]

## 2014-08-20 ENCOUNTER — Observation Stay (HOSPITAL_COMMUNITY): Payer: Medicare Other

## 2014-08-20 DIAGNOSIS — R079 Chest pain, unspecified: Secondary | ICD-10-CM | POA: Diagnosis not present

## 2014-08-20 DIAGNOSIS — E785 Hyperlipidemia, unspecified: Secondary | ICD-10-CM

## 2014-08-20 DIAGNOSIS — N179 Acute kidney failure, unspecified: Secondary | ICD-10-CM | POA: Insufficient documentation

## 2014-08-20 DIAGNOSIS — I10 Essential (primary) hypertension: Secondary | ICD-10-CM

## 2014-08-20 DIAGNOSIS — I129 Hypertensive chronic kidney disease with stage 1 through stage 4 chronic kidney disease, or unspecified chronic kidney disease: Secondary | ICD-10-CM | POA: Diagnosis not present

## 2014-08-20 DIAGNOSIS — I2510 Atherosclerotic heart disease of native coronary artery without angina pectoris: Secondary | ICD-10-CM | POA: Diagnosis not present

## 2014-08-20 DIAGNOSIS — I25110 Atherosclerotic heart disease of native coronary artery with unstable angina pectoris: Secondary | ICD-10-CM

## 2014-08-20 DIAGNOSIS — I739 Peripheral vascular disease, unspecified: Secondary | ICD-10-CM

## 2014-08-20 LAB — URINALYSIS, ROUTINE W REFLEX MICROSCOPIC
Bilirubin Urine: NEGATIVE
GLUCOSE, UA: NEGATIVE mg/dL
Hgb urine dipstick: NEGATIVE
KETONES UR: NEGATIVE mg/dL
Leukocytes, UA: NEGATIVE
NITRITE: NEGATIVE
PROTEIN: 100 mg/dL — AB
Specific Gravity, Urine: 1.015 (ref 1.005–1.030)
Urobilinogen, UA: 0.2 mg/dL (ref 0.0–1.0)
pH: 7 (ref 5.0–8.0)

## 2014-08-20 LAB — CREATININE, URINE, RANDOM: Creatinine, Urine: 35.39 mg/dL

## 2014-08-20 LAB — GLUCOSE, CAPILLARY: GLUCOSE-CAPILLARY: 86 mg/dL (ref 70–99)

## 2014-08-20 LAB — BASIC METABOLIC PANEL
ANION GAP: 7 (ref 5–15)
BUN: 23 mg/dL (ref 6–23)
CHLORIDE: 110 meq/L (ref 96–112)
CO2: 24 mmol/L (ref 19–32)
Calcium: 8.8 mg/dL (ref 8.4–10.5)
Creatinine, Ser: 1.85 mg/dL — ABNORMAL HIGH (ref 0.50–1.35)
GFR calc Af Amer: 35 mL/min — ABNORMAL LOW (ref >90)
GFR calc non Af Amer: 30 mL/min — ABNORMAL LOW (ref >90)
GLUCOSE: 83 mg/dL (ref 70–99)
Potassium: 3.9 mmol/L (ref 3.5–5.1)
Sodium: 141 mmol/L (ref 135–145)

## 2014-08-20 LAB — LIPID PANEL
CHOLESTEROL: 131 mg/dL (ref 0–200)
HDL: 45 mg/dL (ref >39)
LDL Cholesterol: 60 mg/dL (ref 0–99)
TRIGLYCERIDES: 129 mg/dL (ref <150)
Total CHOL/HDL Ratio: 2.9 RATIO
VLDL: 26 mg/dL (ref 0–40)

## 2014-08-20 LAB — HEMOGLOBIN A1C
HEMOGLOBIN A1C: 5.7 % — AB (ref <5.7)
Mean Plasma Glucose: 117 mg/dL — ABNORMAL HIGH (ref <117)

## 2014-08-20 LAB — TROPONIN I
TROPONIN I: 0.03 ng/mL (ref <0.031)
TROPONIN I: 0.03 ng/mL (ref <0.031)
Troponin I: 0.04 ng/mL — ABNORMAL HIGH (ref <0.031)
Troponin I: 0.05 ng/mL — ABNORMAL HIGH (ref <0.031)
Troponin I: 0.04 ng/mL — ABNORMAL HIGH (ref <0.031)

## 2014-08-20 LAB — CBC
HCT: 35.7 % — ABNORMAL LOW (ref 39.0–52.0)
HEMOGLOBIN: 11.9 g/dL — AB (ref 13.0–17.0)
MCH: 31 pg (ref 26.0–34.0)
MCHC: 33.3 g/dL (ref 30.0–36.0)
MCV: 93 fL (ref 78.0–100.0)
Platelets: 157 10*3/uL (ref 150–400)
RBC: 3.84 MIL/uL — ABNORMAL LOW (ref 4.22–5.81)
RDW: 14.3 % (ref 11.5–15.5)
WBC: 7.4 10*3/uL (ref 4.0–10.5)

## 2014-08-20 LAB — URINE MICROSCOPIC-ADD ON

## 2014-08-20 LAB — TSH: TSH: 2.107 u[IU]/mL (ref 0.350–4.500)

## 2014-08-20 LAB — SODIUM, URINE, RANDOM: SODIUM UR: 101 mmol/L

## 2014-08-20 MED ORDER — METOPROLOL TARTRATE 25 MG PO TABS: 25.0000 mg | ORAL_TABLET | Freq: Two times a day (BID) | ORAL | Status: DC

## 2014-08-20 MED ORDER — METOPROLOL TARTRATE 12.5 MG HALF TABLET: 12.5000 mg | ORAL_TABLET | Freq: Two times a day (BID) | ORAL | Status: DC

## 2014-08-20 MED ORDER — TAMSULOSIN HCL 0.4 MG PO CAPS
0.4000 mg | ORAL_CAPSULE | Freq: Every day | ORAL | Status: DC
  Administered 2014-08-20 – 2014-08-21 (×2): 0.4 mg via ORAL
  Filled 2014-08-20 (×2): qty 1

## 2014-08-20 MED ORDER — AMLODIPINE BESYLATE 5 MG PO TABS: 5.0000 mg | ORAL_TABLET | Freq: Two times a day (BID) | ORAL | Status: DC

## 2014-08-20 MED ORDER — AMLODIPINE BESYLATE 10 MG PO TABS
10.0000 mg | ORAL_TABLET | Freq: Every day | ORAL | Status: DC
  Administered 2014-08-20 – 2014-08-21 (×2): 10 mg via ORAL
  Filled 2014-08-20 (×2): qty 1

## 2014-08-20 NOTE — Progress Notes (Signed)
OT Cancellation Note  Patient Details Name: [name] [name] MRN: [id number] DOB: 1920/02/25   Cancelled Treatment:    Reason Eval/Treat Not Completed: Medical issues which prohibited therapy (Pt with elevated troponin, on strict bed rest.)  Evern BioMayberry, Britani Beattie Lynn 08/20/2014, 8:17 AM

## 2014-08-20 NOTE — Progress Notes (Addendum)
SUBJECTIVE:  Very hard of hearing.  Denies CP  OBJECTIVE:   Vitals:   Filed Vitals:   08/19/14 2115 08/19/14 2214 08/19/14 2306 08/20/14 0522  BP: 154/77 164/85  153/80  Pulse: 64 69 64   Temp:  98 F (36.7 C)  98.4 F (36.9 C)  TempSrc:  Oral  Oral  Resp: 18 18 18 17   Height:  5\' 6"  (1.676 m)    Weight:  144 lb 6.4 oz (65.5 kg)    SpO2: 98% 94% 94% 94%   I&O's:   Intake/Output Summary (Last 24 hours) at 08/20/14 [id number] Last data filed at 08/20/14 [id number]  Gross per 24 hour  Intake    600 ml  Output    675 ml  Net    -75 ml   TELEMETRY: Reviewed telemetry pt in NSR:     PHYSICAL EXAM General: Well developed, well nourished, in no acute distress Head: Eyes PERRLA, No xanthomas.   Normal cephalic and atramatic  Lungs:   Clear bilaterally to auscultation and percussion. Heart:   HRRR S1 S2 Pulses are 2+ & equal Abdomen: Bowel sounds are positive, abdomen soft and non-tender without masses  Extremities:   No clubbing, cyanosis or edema.  DP +1 Neuro: Alert and oriented X 3. Psych:  Good affect, responds appropriately   LABS: Basic Metabolic Panel:  Recent Labs  34/74/2501/14/16 1911 08/20/14 0441  NA 140 141  K 4.4 3.9  CL 108 110  CO2 22 24  GLUCOSE 113* 83  BUN 29* 23  CREATININE 2.09* 1.85*  CALCIUM 9.4 8.8   Liver Function Tests:  Recent Labs  08/19/14 1911  AST 37  ALT 19  ALKPHOS 89  BILITOT 0.6  PROT 7.1  ALBUMIN 3.6   No results for input(s): LIPASE, AMYLASE in the last 72 hours. CBC:  Recent Labs  08/19/14 1911 08/20/14 0441  WBC 7.4 7.4  HGB 11.9* 11.9*  HCT 35.1* 35.7*  MCV 92.9 93.0  PLT 156 157   Cardiac Enzymes:  Recent Labs  08/19/14 2316 08/20/14 0441  TROPONINI 0.03 0.04*   BNP: Invalid input(s): POCBNP D-Dimer: No results for input(s): DDIMER in the last 72 hours. Hemoglobin A1C: No results for input(s): HGBA1C in the last 72 hours. Fasting Lipid Panel:  Recent Labs  08/20/14 0441  CHOL 131  HDL 45  LDLCALC 60   TRIG [id number]  CHOLHDL 2.9   Thyroid Function Tests:  Recent Labs  08/19/14 2316  TSH 2.107   Anemia Panel: No results for input(s): VITAMINB12, FOLATE, FERRITIN, TIBC, IRON, RETICCTPCT in the last 72 hours. Coag Panel:   Lab Results  Component Value Date   INR 1.11 08/19/2014   INR 1.07 10/04/2009   INR 1.0 03/10/2008    RADIOLOGY: [place] Renal  08/20/2014   CLINICAL DATA:  Acute renal failure.  EXAM: RENAL/URINARY TRACT ULTRASOUND COMPLETE  COMPARISON:  CT 06/01/2014  FINDINGS: Right Kidney:  Length: 8.4 cm. Mild renal cortical thinning. No hydronephrosis. Small anechoic cyst measuring 12 mm midpole.  Left Kidney:  Length: 8.6 cm.  Moderate cortical thinning.  No hydronephrosis.  Bladder:  The bladder is moderately distended. There is some echogenic material within the bladder.  IMPRESSION: 1. Bilateral renal cortical thinning.  No hydronephrosis. 2. Echogenic debris within the bladder.   Electronically Signed   By: Genevive BiStewart  Edmunds M.D.   On: 08/20/2014 07:29   Dg Chest Portable 1 View  08/19/2014   CLINICAL DATA:  Left-sided chest pain.  EXAM:  PORTABLE CHEST - 1 VIEW  COMPARISON:  CT of the chest on 10/24/2011  FINDINGS: The heart size and mediastinal contours are within normal limits. Both lungs are clear. The visualized skeletal structures are unremarkable.  IMPRESSION: No active disease.   Electronically Signed   By: Rosalie Gums M.D.   On: 08/19/2014 20:13    ASSESSMENT/PLAN: 1.  Chest pain which has resolved.  Troponin slightly bumped. EKG has deeply inverted T waves in the anterior precordial leads but unchanged from EKGs dating back to 2011.  The patient is very hard of hearing so difficult to get an accurate history.  He cannot tell me what his pain felt like. Given his advanced age, CKD with increased creatinine and comorbidities I think we should take a conservative approach.  Will cycle cardiac enzymes to see the trend but if remain flat would opt for medical management of his  angina.  I will get a 2D echo today to assess LVF.  Continue amlodipine/ASA/statin/Plavix and increase amlodipine to  daily.  If he has more CP consider adding long acting nitrates.  HR in low 60's so will avoid BB. 2.  ASCAD with h/o PCI to the LCx in 2011- continue ASA/Plavix and statin 3.  PVD with thoracoabdominal aneurysm followed by Dr.Dickson 4.  Dyslipidemia - on statin and LDL at goal 5.  HTN -mildly elevated.  Increase amlodipine to  daily  Quintella Reichert, MD  08/20/2014  8:23 AM

## 2014-08-20 NOTE — Progress Notes (Signed)
PROGRESS NOTE  Iran PlanasBoyd J Devaux JYN:[id number]:[id number] DOB: 1919-09-24 DOA: 08/19/2014 PCP: Josue HectorNYLAND,LEONARD ROBERT, MD  HPI/Subjective:  [name] [name] is a 81 y/o male that came to the ED via EMS complaining of left sided dull chest pain that had resolved by the time he reached the ED.  He has a hx of MI in 2007, CAD (s/p NSTEMI and stent 2011), preipheral vascular disease, thoracoabdominal aneurysm, hypertension, dyslipidemia, hypothyroidism, carotid artery stenosis, and chronic kidney disease-stage III.  In the ED, reported that the chest pain was: moderate, constant, non-radiating, non-pleuritic, not aggravated with deep breath, not associated with palpitation or SOB.  Was improved with baby asprin and nitroglycerin.  Symptoms lasted about an hour.  Work up in the ED demonstrates T-wave inversion in inferior leads and precordial leads diffusely, which existed in previous EKG on 04/14/13. Troponin was negative. Chest x-ray was negative for acute abnormalities. He says he's had no chest pain or shortness of breath today.  He has also had a slight worsening: Cr was 2.09 yesterday compared to 1.68 on 10/27; down to 1.85 today. GFR was also down from October.  Renal US showed  bilateral renal cortical thinning without hydronephrosis and echogenic debris within the bladder.  Patient says his bowel and bladder functions have been normal.   No chest pain this morning, however patient is very hard of hearing          Assessment/Plan: Chest pain: has hx of CAD with PCI. Patient was admitted, troponins were cycled, only very minimally elevated. Given advanced age, chronic kidney disease, suspect patient would be best managed with conservative means. Await 2-D echocardiogram, await further cardiology recommendations. Continue with aspirin, Plavix, statin. Follow clinical clinical course.  Acute on CKD-III: Suspect acute renal failure likely secondary to prerenal azotemia, creatinine improved to 1.68 following gentle  hydration. Looks euvolemic, we will now stop all IV fluids.  Renal US showed  bilateral renal cortical thinning without hydronephrosis and echogenic debris within the bladder.    Hypothyroidism: Continue SynthroidTSH wnl.  Hypertension: Increased amlodipine to 10mg  daily-follow BP trend  Aneurysm: Patient has 6 cm AAA and 4 cm thoracic aortic aneurysm. Patient has been followed up by a vascular surgeon, Dr. Edilia Boickson.  Follow-up with vascular surgeon.  No acute issues.  PVD with thoracoabdominal aneurysm followed by Dr.Dickson  Dyslipidemia - on statin and LDL, at goal  DVT Prophylaxis:  SQ Heparin  Code Status: DNR Family Communication: None at bedside  Disposition Plan: Inpatient-[facility] next few days   Consultants:  Cardiology  Procedures: EKG 1/14: T-wave inversion in inferior leads and precordial leads which has not changed from previous EKG.  Not a STEMI.   Antibiotics: Anti-infectives    None       Objective: Filed Vitals:   08/19/14 2214 08/19/14 2306 08/20/14 0522 08/20/14 0900  BP: 164/85  153/80   Pulse: 69 64    Temp: 98 F (36.7 C)  98.4 F (36.9 C)   TempSrc: Oral  Oral   Resp: 18 18 17    Height: 5\' 6"  (1.676 m)     Weight: 65.5 kg (144 lb 6.4 oz)     SpO2: 94% 94% 94% 97%    Intake/Output Summary (Last 24 hours) at 08/20/14 1047 Last data filed at 08/20/14 0657  Gross per 24 hour  Intake    600 ml  Output    675 ml  Net    -75 ml   Filed Weights   08/19/14 2214  Weight: 65.5 kg (  144 lb 6.4 oz)    Exam: General: NAD, missing one eye, laying down in bed HEENT:  Anicteic Sclera on right, MMM.  Difficulty hearing. Neck: Supple, no JVD, no masses  Cardiovascular: RRR, S1 S2 auscultated, no rubs, murmurs or gallops.   Respiratory: Clear to auscultation bilaterally with equal chest rise  Abdomen: Soft, slightly tender on LUQ, nondistended, + bowel sounds  Extremities: warm dry without cyanosis clubbing or edema.  Neuro: AAOx3. Strength 5/5 in  lower extremities  Skin: Without rashes exudates or nodules.   Psych: Normal affect and demeanor with intact judgement and insight   Data Reviewed: Basic Metabolic Panel:  Recent Labs Lab 08/19/14 1911 08/20/14 0441  NA 140 141  K 4.4 3.9  CL 108 110  CO2 22 24  GLUCOSE 113* 83  BUN 29* 23  CREATININE 2.09* 1.85*  CALCIUM 9.4 8.8   Liver Function Tests:  Recent Labs Lab 08/19/14 1911  AST 37  ALT 19  ALKPHOS 89  BILITOT 0.6  PROT 7.1  ALBUMIN 3.6   No results for input(s): LIPASE, AMYLASE in the last 168 hours. No results for input(s): AMMONIA in the last 168 hours. CBC:  Recent Labs Lab 08/19/14 1911 08/20/14 0441  WBC 7.4 7.4  HGB 11.9* 11.9*  HCT 35.1* 35.7*  MCV 92.9 93.0  PLT 156 157   Cardiac Enzymes:  Recent Labs Lab 08/19/14 2316 08/20/14 0441  TROPONINI 0.03 0.04*   BNP (last 3 results) No results for input(s): PROBNP in the last 8760 hours. CBG:  Recent Labs Lab 08/20/14 0626  GLUCAP 86    No results found for this or any previous visit (from the past 240 hour(s)).   Studies: US Renal  08/20/2014   CLINICAL DATA:  Acute renal failure.  EXAM: RENAL/URINARY TRACT ULTRASOUND COMPLETE  COMPARISON:  CT 06/01/2014  FINDINGS: Right Kidney:  Length: 8.4 cm. Mild renal cortical thinning. No hydronephrosis. Small anechoic cyst measuring 12 mm midpole.  Left Kidney:  Length: 8.6 cm.  Moderate cortical thinning.  No hydronephrosis.  Bladder:  The bladder is moderately distended. There is some echogenic material within the bladder.  IMPRESSION: 1. Bilateral renal cortical thinning.  No hydronephrosis. 2. Echogenic debris within the bladder.   Electronically Signed   By: Genevive Bi M.D.   On: 08/20/2014 07:29   Dg Chest Portable 1 View  08/19/2014   CLINICAL DATA:  Left-sided chest pain.  EXAM: PORTABLE CHEST - 1 VIEW  COMPARISON:  CT of the chest on 10/24/2011  FINDINGS: The heart size and mediastinal contours are within normal limits. Both  lungs are clear. The visualized skeletal structures are unremarkable.  IMPRESSION: No active disease.   Electronically Signed   By: Rosalie Gums M.D.   On: 08/19/2014 20:13    Scheduled Meds: . amLODipine  10 mg Oral Daily  . aspirin EC  81 mg Oral Daily  . atorvastatin  10 mg Oral Daily  . clopidogrel  75 mg Oral Daily  . Folic Acid-Vit B6-Vit B12  1 tablet Oral Daily  . gabapentin  300 mg Oral Daily  . heparin  5,000 Units Subcutaneous 3 times per day  . levothyroxine  50 mcg Oral QAC breakfast  . loratadine  10 mg Oral Daily  . meclizine  12.5 mg Oral Daily  . mometasone-formoterol  2 puff Inhalation BID  . multivitamin  1 tablet Oral BID  . multivitamin with minerals  1 tablet Oral Daily  . prednisoLONE acetate  1  drop Right Eye TID  . sodium chloride  3 mL Intravenous Q12H   Continuous Infusions:   Principal Problem:   Chest pain Active Problems:   HLD (hyperlipidemia)   Essential hypertension   CAD, NATIVE VESSEL   Thoraco abdominal aneurysm   PVD (peripheral vascular disease)   Abdominal aortic aneurysm   Hypothyroidism  [place] Citizen E. Howell-Methvin, PA-S  [facility] Pager [phone]. If 7PM-7AM, please contact night-coverage at [email or web address], password [facility] 08/20/2014, 10:47 AM  LOS: 1 day    Attending Patient was seen, examined,treatment plan was discussed with the  Advance Practice Provider.  I have directly reviewed the clinical findings, lab, imaging studies and management of this patient in detail. I have made the necessary changes to the above noted documentation, and agree with the documentation, as recorded by the Advance Practice Provider.   Admitted with chest pain-has history of coronary artery disease, cardiology following. Rest as above.  Windell Norfolk MD [facility].

## 2014-08-20 NOTE — Evaluation (Signed)
Physical Therapy Evaluation/ Discharge Patient Details Name: [name] [name] MRN: [id number] DOB: 04-24-1920 Today's Date: 08/20/2014   History of Present Illness   81 y.o. male with history of CAD (s/p of stent 2011), peripheral vascular disease,  thoracoabdominal aneurysm, hypertension, dyslipidemia, hypothyroidism, carotid artery stenosis, chronic kidney disease-stage III, who presents with chest pain. EKG unchanged, slight bump in troponin but cleared for mobility by Dr.Ghimire  Clinical Impression  Pt very pleasant and HOH. Pt still washes dishes, walks to the post office in the summer. Wife assists with bills, shopping and driving, dgtrs do heavy housework. Pt performs mobility and ADLs on his own. Dgtr reports pt has had 2 falls on basement stairs but none since they installed light and reflective tape on stairs to show change in elevation. Pt at baseline functional level with good family support who desires to continue being at home and mobilizing as long as he possibly can. Pt does move remarkably well for his age and do not feel pt requires further therapy at this time. Will sign off with pt and dgtr aware and in agreement with recommendation for daily ambulation with staff.   Follow Up Recommendations No PT follow up    Equipment Recommendations  None recommended by PT    Recommendations for Other Services       Precautions / Restrictions Precautions Precautions: Fall      Mobility  Bed Mobility Overal bed mobility: Modified Independent                Transfers Overall transfer level: Modified independent                  Ambulation/Gait Ambulation/Gait assistance: Modified independent (Device/Increase time) Ambulation Distance (Feet): 450 Feet Assistive device: None Gait Pattern/deviations: Step-through pattern;Decreased stride length   Gait velocity interpretation: at or above normal speed for age/gender    Stairs Stairs: Yes Stairs assistance:  Modified independent (Device/Increase time) Stair Management: One rail Right;Alternating pattern;Forwards Number of Stairs: 3    Wheelchair Mobility    Modified Rankin (Stroke Patients Only)       Balance Overall balance assessment: History of Falls                                           Pertinent Vitals/Pain Pain Assessment: No/denies pain  sats 94% HR 88    Home Living Family/patient expects to be discharged to:: Private residence Living Arrangements: Spouse/significant other Available Help at Discharge: Family;Available 24 hours/day Type of Home: House Home Access: Stairs to enter Entrance Stairs-Rails: Right Entrance Stairs-Number of Steps: 3 Home Layout: Two level;Laundry or work area in Pitney Bowesbasement Home Equipment: Environmental consultantWalker - 2 wheels;Wheelchair - manual;Bedside commode      Prior Function Level of Independence: Independent               Hand Dominance        Extremity/Trunk Assessment   Upper Extremity Assessment: Overall WFL for tasks assessed           Lower Extremity Assessment: Overall WFL for tasks assessed      Cervical / Trunk Assessment: Normal  Communication   Communication: HOH;Other (comment) (no eye on left, glaucoma right eye)  Cognition Arousal/Alertness: Awake/alert Behavior During Therapy: WFL for tasks assessed/performed Overall Cognitive Status: Within Functional Limits for tasks assessed  General Comments      Exercises        Assessment/Plan    PT Assessment Patent does not need any further PT services  PT Diagnosis Generalized weakness   PT Problem List    PT Treatment Interventions     PT Goals (Current goals can be found in the Care Plan section) Acute Rehab PT Goals PT Goal Formulation: All assessment and education complete, DC therapy    Frequency     Barriers to discharge        Co-evaluation               End of Session   Activity  Tolerance: Patient tolerated treatment well Patient left: in chair;with call bell/phone within reach;with family/visitor present Nurse Communication: Mobility status         Time: 1030-1050 PT Time Calculation (min) (ACUTE ONLY): 20 min   Charges:   PT Evaluation $Initial PT Evaluation Tier I: 1 Procedure     PT G CodesDelorse Lek 08/20/2014, 10:55 AM Delaney Meigs, PT [phone]

## 2014-08-20 NOTE — Evaluation (Signed)
Occupational Therapy Evaluation Patient Details Name: [name] [name] MRN: [id number] DOB: [birth year] Today's Date: 08/20/2014    History of Present Illness  81 y.o. male with history of CAD (s/p of stent 2011), peripheral vascular disease,  thoracoabdominal aneurysm, hypertension, dyslipidemia, hypothyroidism, carotid artery stenosis, chronic kidney disease-stage III, who presents with chest pain. EKG unchanged, slight bump in troponin but cleared for mobility by Dr.Ghimire   Clinical Impression   Pt was performing self care at a modified independent level prior to admission and participated in some light housekeeping activities.  Pt's family has adapted the home for safety and to maximize pt's independence given his advanced age and vision impairment.  He has excellent family support.  No further OT needs.    Follow Up Recommendations  No OT follow up    Equipment Recommendations  None recommended by OT    Recommendations for Other Services       Precautions / Restrictions Precautions Precautions: Fall      Mobility Bed Mobility Overal bed mobility: Modified Independent                Transfers Overall transfer level: Modified independent                    Balance Overall balance assessment: History of Falls                                          ADL Overall ADL's : At baseline                                       General ADL Comments: Pt sits on a shower stool low to the ground to dress his LEs.     Vision                 Additional Comments: family uses bright lights and has used bright taping to assist pt in perceiving end of staircase.   Perception Perception Comments: impaired depth perception due to no eye   Praxis      Pertinent Vitals/Pain Pain Assessment: No/denies pain     Hand Dominance Right   Extremity/Trunk Assessment Upper Extremity Assessment Upper Extremity Assessment: Overall  WFL for tasks assessed   Lower Extremity Assessment Lower Extremity Assessment: Overall WFL for tasks assessed   Cervical / Trunk Assessment Cervical / Trunk Assessment: Normal   Communication Communication Communication: HOH   Cognition Arousal/Alertness: Awake/alert Behavior During Therapy: WFL for tasks assessed/performed Overall Cognitive Status: Within Functional Limits for tasks assessed       Memory: Decreased short-term memory             General Comments       Exercises       Shoulder Instructions      Home Living Family/patient expects to be discharged to:: Private residence Living Arrangements: Spouse/significant other Available Help at Discharge: Family;Available 24 hours/day Type of Home: House Home Access: Stairs to enter [employer] of Steps: 3 Entrance Stairs-Rails: Right Home Layout: Two level;Laundry or work area in Artist of Steps: pt's preferred shower in basement   Bathroom Shower/Tub: Producer, television/film/video: Handicapped height     Home Equipment: Environmental consultant - 2 wheels;Wheelchair - [employer];Shower seat;Grab bars - toilet;Grab bars -  tub/shower          Prior Functioning/Environment Level of Independence: Independent with assistive device(s)             OT Diagnosis:     OT Problem List:     OT Treatment/Interventions:      OT Goals(Current goals can be found in the care plan section) Acute Rehab OT Goals Patient Stated Goal: return home today  OT Frequency:     Barriers to D/C:            Co-evaluation              End of Session Equipment Utilized During Treatment: Gait belt  Activity Tolerance: Patient tolerated treatment well Patient left: in chair;with call bell/phone within reach;with family/visitor present   Time: 4540-98111225-1247 OT Time Calculation (min): 22 min Charges:  OT General Charges $OT Visit: 1 Procedure OT Evaluation $Initial OT  Evaluation Tier I: 1 Procedure OT Treatments $Self Care/Home Management : 8-22 mins G-Codes:    Evern BioMayberry, Wilburn Keir Lynn 08/20/2014, 1:56 PM  [phone]

## 2014-08-20 NOTE — Progress Notes (Signed)
PT Cancellation Note  Patient Details Name: [name] [name] MRN: [id number] DOB: [birth year]   Cancelled Treatment:    Reason Eval/Treat Not Completed: Medical issues which prohibited therapy (pt with elevated troponin and on strict bedrest)   [name], [name] 08/20/2014, 7:09 AM [name] [name], PT [phone]

## 2014-08-20 NOTE — Progress Notes (Signed)
Patient c/o of burning in lower abdo into scrotum . "Something is wrong there" Patient stated. Bladder scan 548 ml after patient voided 100 ml clr yellow urine. Patient vding sm. freq amt. K.Kirby N.P. Page and aware and Urine sent for U.A. Also page K.Kirby with results of bladder scan.

## 2014-08-20 NOTE — Care Management Note (Signed)
    Page 1 of 1   08/21/2014     1:18:26 PM CARE MANAGEMENT NOTE 08/21/2014  Patient:  [name] [name],[name] [name]   Account Number:  [id number]  Date Initiated:  08/20/2014  Documentation initiated by:  [name] [name],[name] [name]  Subjective/Objective Assessment:   Pt admitted with c/p     Action/Plan:   PTA pt lived at home with spouse- daughters live nearby to assist   Anticipated DC Date:  08/21/2014   Anticipated DC Plan:  HOME/SELF CARE      DC Planning Services  NA      [facility]  NA   Choice offered to / List presented to:             Status of service:  Completed, signed off Medicare Important Message given?   (If response is "NO", the following Medicare IM given date fields will be blank) Date Medicare IM given:   Medicare IM given by:   Date Additional Medicare IM given:   Additional Medicare IM given by:    Discharge Disposition:  HOME/SELF CARE  Per UR Regulation:  Reviewed for med. necessity/level of care/duration of stay  If discussed at Long Length of Stay Meetings, dates discussed:    Comments:  08/21/14 12:00 CM was called to room to go overCC44.  CM explained pt did not meet criteria for inpatient status. CM asked if they had any Home helath needs.  The daughter stated she understands and feels this is a global problem with healthcare but plans to file an appeal with her insurance company and will be calling the accounting number given to her on the CC44.  No other CM needs were communicated.  [name] [name], [name] [name], [name] [name] [phone].   08/20/14- 1445- [name] PieriniKristi Webster RN, [name] [name] [phone] CC44 given to pt and daughter, copy placed in shadow chart.

## 2014-08-21 DIAGNOSIS — I059 Rheumatic mitral valve disease, unspecified: Secondary | ICD-10-CM

## 2014-08-21 DIAGNOSIS — E039 Hypothyroidism, unspecified: Secondary | ICD-10-CM

## 2014-08-21 DIAGNOSIS — R079 Chest pain, unspecified: Secondary | ICD-10-CM | POA: Diagnosis not present

## 2014-08-21 LAB — BASIC METABOLIC PANEL
Anion gap: 12 (ref 5–15)
BUN: 26 mg/dL — ABNORMAL HIGH (ref 6–23)
CALCIUM: 9 mg/dL (ref 8.4–10.5)
CO2: 22 mmol/L (ref 19–32)
CREATININE: 1.77 mg/dL — AB (ref 0.50–1.35)
Chloride: 108 mEq/L (ref 96–112)
GFR calc Af Amer: 37 mL/min — ABNORMAL LOW (ref >90)
GFR calc non Af Amer: 32 mL/min — ABNORMAL LOW (ref >90)
GLUCOSE: 97 mg/dL (ref 70–99)
Potassium: 4 mmol/L (ref 3.5–5.1)
Sodium: 142 mmol/L (ref 135–145)

## 2014-08-21 LAB — GLUCOSE, CAPILLARY: GLUCOSE-CAPILLARY: 83 mg/dL (ref 70–99)

## 2014-08-21 MED ORDER — PERFLUTREN LIPID MICROSPHERE
1.0000 mL | INTRAVENOUS | Status: AC | PRN
Start: 2014-08-21 — End: 2014-08-21
  Administered 2014-08-21: 2 mL via INTRAVENOUS
  Filled 2014-08-21: qty 10

## 2014-08-21 MED ORDER — TAMSULOSIN HCL 0.4 MG PO CAPS: 0.4000 mg | ORAL_CAPSULE | Freq: Every day | ORAL | Status: DC

## 2014-08-21 MED ORDER — PERFLUTREN LIPID MICROSPHERE
1.0000 mL | INTRAVENOUS | Status: DC | PRN
  Filled 2014-08-21: qty 10

## 2014-08-21 NOTE — Progress Notes (Signed)
Patient ready for discharge.  D/C instructions and Medications reviewed with the patient's daughter. Follow up appointments reviewed with patient's daughter.  She voices understanding to teaching.  Patient to door via wheelchair. Home via POV with his daughter driving.

## 2014-08-21 NOTE — Discharge Summary (Signed)
PATIENT DETAILS Name: [name] [name] Age: 81 y.o. Sex: male Date of Birth: 1919-11-16 MRN: [id number]. Admitting Physician: Lorretta Harp, MD WUJ:WJXBJY,NWGNFAO ROBERT, MD  Admit Date: 08/19/2014 Discharge date: 08/21/2014  Recommendations for Outpatient Follow-up:  1. 2-D echocardiogram pending at the time of discharge-please follow.  PRIMARY DISCHARGE DIAGNOSIS:  Principal Problem:   Chest pain Active Problems:   HLD (hyperlipidemia)   Essential hypertension   CAD, NATIVE VESSEL   Thoraco abdominal aneurysm   PVD (peripheral vascular disease)   Abdominal aortic aneurysm   Hypothyroidism   AKI (acute kidney injury)      PAST MEDICAL HISTORY: Past Medical History  Diagnosis Date  . HLD (hyperlipidemia)   . Abnormal EKG     left ventricular hypertrophy and secondary repolarization changes   . HTN (hypertension)     boarderline control   . Thoracic aortic aneurysm   . CAD in native artery     with NSTEMI and stent to circumflex coronary artery 3/11. nml left ventricular function   . PVD (peripheral vascular disease)     with thoracic abd aneurysm; follwed by Dr. Earley Brooke. signif. enlarged measuring 6 cm  . Dyslipidemia   . Abdominal aortic aneurysm     Followed by vascular surgery  . Myocardial infarction 2007  . Macular degeneration   . Spinal stenosis     pt.s daughter states about 2 weeks ago 10-09-2011 had "cortisone injection  in his back"    DISCHARGE MEDICATIONS: Current Discharge Medication List    START taking these medications   Details  tamsulosin (FLOMAX) 0.4 MG CAPS capsule Take 1 capsule (0.4 mg total) by mouth daily. Qty: 30 capsule, Refills: 0      CONTINUE these medications which have NOT CHANGED   Details  albuterol (PROVENTIL HFA;VENTOLIN HFA) 108 (90 BASE) MCG/ACT inhaler Inhale 2 puffs into the lungs every 6 (six) hours as needed for wheezing or shortness of breath.     amLODipine (NORVASC) 2.5 MG tablet Take 1 tablet (2.5 mg total) by  mouth 2 (two) times daily. Qty: 180 tablet, Refills: 3    aspirin 81 MG EC tablet Take 81 mg by mouth daily.      atorvastatin (LIPITOR) 10 MG tablet Take 1 tablet by mouth daily.    clopidogrel (PLAVIX) 75 MG tablet Take 1 tablet (75 mg total) by mouth daily. Qty: 90 tablet, Refills: 3    Fluticasone-Salmeterol (ADVAIR DISKUS) 250-50 MCG/DOSE AEPB Inhale 1 puff into the lungs 2 (two) times daily.    Folic Acid-Vit B6-Vit B12 (FOLBEE) 2.5-25-1 MG TABS Take 1 tablet by mouth daily.      gabapentin (NEURONTIN) 300 MG capsule Take 1 capsule by mouth daily.    levothyroxine (SYNTHROID, LEVOTHROID) 75 MCG tablet Take 75 mcg by mouth daily before breakfast.    loratadine (CLARITIN) 10 MG tablet Take 10 mg by mouth daily.      meclizine (ANTIVERT) 12.5 MG tablet Take 12.5 mg by mouth 3 (three) times daily as needed for dizziness.     meloxicam (MOBIC) 7.5 MG tablet Take 7.5 mg by mouth daily.    Multiple Vitamin (MULTIVITAMIN) tablet Take 1 tablet by mouth daily.      Multiple Vitamins-Minerals (MH MACULAR HEALTH PO) Take 1 capsule by mouth 2 (two) times daily.    nitroGLYCERIN (NITROSTAT) 0.4 MG SL tablet Place 0.4 mg under the tongue every 5 (five) minutes as needed.      prednisoLONE acetate (PRED MILD) 0.12 % ophthalmic suspension Place  1 drop into the right eye 3 (three) times daily.     Specialty Vitamins Products (PROSTATE) TABS Take 1 tablet by mouth daily.         ALLERGIES:   Allergies  Allergen Reactions  . Ciprofloxacin     REACTION: swelling of joints,mouth    BRIEF HPI:  See H&P, Labs, Consult and Test reports for all details in brief, patient is a 81 year old male with a known history of coronary artery disease was admitted for evaluation of chest pain.  CONSULTATIONS:   cardiology  PERTINENT RADIOLOGIC STUDIES: US Renal  08/20/2014   CLINICAL DATA:  Acute renal failure.  EXAM: RENAL/URINARY TRACT ULTRASOUND COMPLETE  COMPARISON:  CT 06/01/2014  FINDINGS:  Right Kidney:  Length: 8.4 cm. Mild renal cortical thinning. No hydronephrosis. Small anechoic cyst measuring 12 mm midpole.  Left Kidney:  Length: 8.6 cm.  Moderate cortical thinning.  No hydronephrosis.  Bladder:  The bladder is moderately distended. There is some echogenic material within the bladder.  IMPRESSION: 1. Bilateral renal cortical thinning.  No hydronephrosis. 2. Echogenic debris within the bladder.   Electronically Signed   By: Genevive Bi M.D.   On: 08/20/2014 07:29   Dg Chest Portable 1 View  08/19/2014   CLINICAL DATA:  Left-sided chest pain.  EXAM: PORTABLE CHEST - 1 VIEW  COMPARISON:  CT of the chest on 10/24/2011  FINDINGS: The heart size and mediastinal contours are within normal limits. Both lungs are clear. The visualized skeletal structures are unremarkable.  IMPRESSION: No active disease.   Electronically Signed   By: Rosalie Gums M.D.   On: 08/19/2014 20:13     PERTINENT LAB RESULTS: CBC:  Recent Labs  08/19/14 1911 08/20/14 0441  WBC 7.4 7.4  HGB 11.9* 11.9*  HCT 35.1* 35.7*  PLT 156 157   CMET CMP     Component Value Date/Time   NA 142 08/21/2014 0430   K 4.0 08/21/2014 0430   CL 108 08/21/2014 0430   CO2 22 08/21/2014 0430   GLUCOSE 97 08/21/2014 0430   BUN 26* 08/21/2014 0430   CREATININE 1.77* 08/21/2014 0430   CREATININE 1.44* 10/19/2011 0913   CALCIUM 9.0 08/21/2014 0430   PROT 7.1 08/19/2014 1911   ALBUMIN 3.6 08/19/2014 1911   AST 37 08/19/2014 1911   ALT 19 08/19/2014 1911   ALKPHOS 89 08/19/2014 1911   BILITOT 0.6 08/19/2014 1911   GFRNONAA 32* 08/21/2014 0430   GFRAA 37* 08/21/2014 0430    GFR Estimated Creatinine Clearance: 24 mL/min (by C-G formula based on Cr of 1.77). No results for input(s): LIPASE, AMYLASE in the last 72 hours.  Recent Labs  08/20/14 0910 08/20/14 1410 08/20/14 2043  TROPONINI 0.05* 0.03 0.04*   Invalid input(s): POCBNP No results for input(s): DDIMER in the last 72 hours.  Recent Labs   08/20/14 0441  HGBA1C 5.7*    Recent Labs  08/20/14 0441  CHOL 131  HDL 45  LDLCALC 60  TRIG 129  CHOLHDL 2.9    Recent Labs  08/19/14 2316  TSH 2.107   No results for input(s): VITAMINB12, FOLATE, FERRITIN, TIBC, IRON, RETICCTPCT in the last 72 hours. Coags:  Recent Labs  08/19/14 1911  INR 1.11   Microbiology: No results found for this or any previous visit (from the past 240 hour(s)).   BRIEF HOSPITAL COURSE:   Principal Problem:   Chest pain: Patient with a known history of CAD with PCI, patient was admitted,troponins were cycled, these  were very minimally elevated. Patient was seen in consult by cardiology, recommendations were to pursue medical management/conservative approach given her advanced age, frailty and known chronic kidney disease. 2-D echo gram was performed, results are currently pending. Patient is pleasantly confused and family is want to take him home, so that he can be in familiar surroundings. No further chest pain post admission, stable for discharge today.   Active Problems: Acute on chronic disease stage III: suspect acute renal failure likely secondary to prerenal azotemia. Creatinine improved and back to baseline at day of discharge.  Hypothyroidism: Continue with Synthroid  Hypertension: Continue with amlodipine  Aneurysm: Patient has 6 cm AAA and 4 cm thoracic aortic aneurysm. Patient has been followed up by a vascular surgeon, Dr. Edilia Boickson. Follow-up with vascular surgeon. No acute issues.  PVD with thoracoabdominal aneurysm followed by Dr.Dickson  Dyslipidemia - on statin and LDL, at goal  Suspected dementia with mild delirium: Suspect patient will thrive in his usual/home settings. Defer further work up to the outpatient setting  BPH: Started Flomax. Please follow.  TODAY-DAY OF DISCHARGE:  Subjective:   [name] [name] today has no headache,no chest abdominal pain,no new weakness tingling or numbness, feels much better wants to  go home today.   Objective:   Blood pressure 139/80, pulse 76, temperature 98.2 F (36.8 C), temperature source Oral, resp. rate 18, height 5\' 6"  (1.676 m), weight 65.5 kg (144 lb 6.4 oz), SpO2 96 %.  Intake/Output Summary (Last 24 hours) at 08/21/14 1119 Last data filed at 08/20/14 2030  Gross per 24 hour  Intake    480 ml  Output    300 ml  Net    180 ml   Filed Weights   08/19/14 2214  Weight: 65.5 kg (144 lb 6.4 oz)    Exam Awake Alert, mildly confused, No new F.N deficits, Normal affect [place].AT,PERRAL Supple Neck,No JVD, No cervical lymphadenopathy appriciated.  Symmetrical Chest wall movement, Good air movement bilaterally, CTAB RRR,No Gallops,Rubs or new Murmurs, No Parasternal Heave +ve B.Sounds, Abd Soft, Non tender, No organomegaly appriciated, No rebound -guarding or rigidity. No Cyanosis, Clubbing or edema, No new Rash or bruise  DISCHARGE CONDITION: Stable  DISPOSITION:  Home  DISCHARGE INSTRUCTIONS:    Activity:  As tolerated with Full fall precautions use walker/cane & assistance as needed  Diet recommendation: Heart Healthy diet  Discharge Instructions    Call MD for:  severe uncontrolled pain    Complete by:  As directed      Diet - low sodium heart healthy    Complete by:  As directed      Increase activity slowly    Complete by:  As directed            Follow-up Information    Follow up with Prentice DockerKONESWARAN, SURESH A, MD. Schedule an appointment as soon as possible for a visit in 2 weeks.   Specialty:  Cardiology   Contact information:   [address] [place] [address] [phone]       Follow up with Josue HectorNYLAND,LEONARD ROBERT, MD. Schedule an appointment as soon as possible for a visit in 1 week.   Specialty:  Family Medicine   Contact information:   [address] [place] [phone]         Total Time spent on discharge equals 45 minutes.  SignedJeoffrey Massed: Refugio Mcconico 08/21/2014 11:19 AM

## 2014-08-21 NOTE — Progress Notes (Signed)
Subjective:  Part of hearing but able to communicate today.  Currently having echo.  No complaints of chest pain or shortness of breath.  Objective:  Vital Signs in the last 24 hours: BP 139/80 mmHg  Pulse 76  Temp(Src) 98.2 F (36.8 C) (Oral)  Resp 18  Ht 5\' 6"  (1.676 m)  Wt 65.5 kg (144 lb 6.4 oz)  BMI 23.32 kg/m2  SpO2 96%  Physical Exam: Pleasant elderly thin male in no acute distress Lungs:  Clear  Cardiac:  Regular rhythm, normal S1 and S2, no S3, 2/6 systolic murmur Extremities:  No edema present  Intake/Output from previous day: 01/15 0701 - 01/16 0700 In: 480 [P.O.:480] Out: 300 [Urine:300] Weight Filed Weights   08/19/14 2214  Weight: 65.5 kg (144 lb 6.4 oz)    Lab Results: Basic Metabolic Panel:  Recent Labs  16/05/9600/15/16 0441 08/21/14 0430  NA 141 142  K 3.9 4.0  CL 110 108  CO2 24 22  GLUCOSE 83 97  BUN 23 26*  CREATININE 1.85* 1.77*    CBC:  Recent Labs  08/19/14 1911 08/20/14 0441  WBC 7.4 7.4  HGB 11.9* 11.9*  HCT 35.1* 35.7*  MCV 92.9 93.0  PLT 156 157   Telemetry: Sinus rhythm  Assessment/Plan:  1.  Chest pain with mild elevations of troponin.  Currently resolved.  In light of age and other comorbidities.  Plan conservative management.  May have been angina. 2.  Coronary artery disease with previous PCI to the circumflex in 2011 3.  Previous thoracoabdominal aneurysm, currently stable. 4.  Hypertension borderline.  Recommendations:  Clinically doing much better at this time without recurrent pain.  Echo is pending.  Okay for discharge from cardiac viewpoint .  If planned.      Darden PalmerW. Spencer Carlyn Mullenbach, Jr.  MD [facility]  08/21/2014, 9:37 AM

## 2014-08-21 NOTE — Progress Notes (Signed)
UR completed 

## 2014-08-21 NOTE — Progress Notes (Signed)
  Echocardiogram 2D Echocardiogram has been performed.  Delcie RochENNINGTON, Charvi Gammage 08/21/2014, 10:16 AM

## 2014-08-21 NOTE — Progress Notes (Signed)
Patient has been up all night, confused. Wants to go home. Keep getting out of bed. Unassisted. Staff will continue to monitor.

## 2014-08-23 NOTE — Progress Notes (Signed)
Addendum for 1/15 Physical therapy note   08/20/14 1055  PT G-Codes **NOT FOR INPATIENT CLASS**  Functional Assessment Tool Used clinical judgement  Functional Limitation Mobility: Walking and moving around  Mobility: Walking and Moving Around Current Status [phone](G8978) CI  Mobility: Walking and Moving Around Goal Status [phone](G8979) CI  Mobility: Walking and Moving Around Discharge Status [phone](G8980) CI  [name] Tabor Ayman Brull, PT [phone]

## 2014-08-25 NOTE — Progress Notes (Signed)
Late entry due to missed g code.   08/25/14 1600  OT G-codes **NOT FOR INPATIENT CLASS**  Functional Assessment Tool Used clinical judgement  Functional Limitation Self care  Self Care Current Status [phone](G8987) CI  Self Care Goal Status (H8469(G8988) CI  Self Care Discharge Status [phone](G8989) CI  08/25/2014 Martie RoundJulie Jayne Peckenpaugh, OTR/L Pager: [phone]

## 2014-09-14 ENCOUNTER — Other Ambulatory Visit: Payer: Self-pay | Admitting: Internal Medicine

## 2015-01-31 ENCOUNTER — Other Ambulatory Visit: Payer: Self-pay

## 2015-02-25 ENCOUNTER — Other Ambulatory Visit: Payer: Self-pay

## 2015-02-25 MED ORDER — AMLODIPINE BESYLATE 2.5 MG PO TABS: 2.5000 mg | ORAL_TABLET | Freq: Two times a day (BID) | ORAL | Status: DC

## 2015-03-25 ENCOUNTER — Other Ambulatory Visit: Payer: Self-pay | Admitting: Cardiovascular Disease

## 2015-03-30 ENCOUNTER — Ambulatory Visit (INDEPENDENT_AMBULATORY_CARE_PROVIDER_SITE_OTHER): Payer: Medicare Other | Admitting: Cardiovascular Disease

## 2015-03-30 ENCOUNTER — Encounter: Payer: Self-pay | Admitting: Cardiovascular Disease

## 2015-03-30 VITALS — BP 121/73 | HR 64 | Ht 66.0 in | Wt 133.0 lb

## 2015-03-30 DIAGNOSIS — I716 Thoracoabdominal aortic aneurysm, without rupture, unspecified: Secondary | ICD-10-CM

## 2015-03-30 DIAGNOSIS — Z9289 Personal history of other medical treatment: Secondary | ICD-10-CM

## 2015-03-30 DIAGNOSIS — I10 Essential (primary) hypertension: Secondary | ICD-10-CM

## 2015-03-30 DIAGNOSIS — I480 Paroxysmal atrial fibrillation: Secondary | ICD-10-CM | POA: Diagnosis not present

## 2015-03-30 DIAGNOSIS — I2510 Atherosclerotic heart disease of native coronary artery without angina pectoris: Secondary | ICD-10-CM

## 2015-03-30 DIAGNOSIS — Z87898 Personal history of other specified conditions: Secondary | ICD-10-CM

## 2015-03-30 MED ORDER — AMLODIPINE BESYLATE 2.5 MG PO TABS: 2.5000 mg | ORAL_TABLET | Freq: Two times a day (BID) | ORAL | Status: AC

## 2015-03-30 MED ORDER — CLOPIDOGREL BISULFATE 75 MG PO TABS: 75.0000 mg | ORAL_TABLET | Freq: Every day | ORAL | Status: AC

## 2015-03-30 NOTE — Progress Notes (Signed)
Patient ID: [name] [name], male   DOB: 04-Sep-1919, 81 y.o.   MRN: [id number]      SUBJECTIVE: The patient presents for routine cardiovascular follow-up. He was hospitalized for chest pain and a mild troponin elevation in January 2016 which was conservatively managed given chronic kidney disease and additional comorbidities as well as age. Echocardiogram at that time demonstrated normal left ventricular systolic function, LVEF 60-65%, with grade 1 diastolic dysfunction. He underwent bare-metal stenting of the distal circumflex March 2011 with nonobstructive LAD and RCA disease.   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 Durwin Nora and declined referral to [facility] for possible stent grafting. He was then again seen by Dr. Durwin Nora who at that time recommended consultation with Dr. Pattricia Boss at [facility] for consideration of either high risk surgery or fenestrated graft insertion. Ultimately, both he and his wife decided however not to pursue further therapy regarding his aneurysm.   He is very hard of hearing.   He denies chest pain. He was recently prescribed steroids by his PCP for shortness of breath with seems to have alleviated his symptoms. His daughter, Erskine Squibb, says he has been doing well for the last 6 weeks. When he lies out in the sun he seems to feel better. His feet neuropathy has been alleviated with gabapentin.  Soc: He landed on [place] on the day after [place] with the 4th division.  He has a daughter, [name], a retired Psychologist, forensic from [employer]. He has another daughter, Erskine Squibb, who taught PE at the same school.  He lives in [place].    Review of Systems: As per "subjective", otherwise negative.  Allergies  Allergen Reactions  . Ciprofloxacin     REACTION: swelling of joints,mouth    Current Outpatient Prescriptions  Medication Sig Dispense Refill  . albuterol (PROVENTIL HFA;VENTOLIN HFA) 108 (90 BASE) MCG/ACT inhaler Inhale 2 puffs into the  lungs every 6 (six) hours as needed for wheezing or shortness of breath.     [name] amLODipine (NORVASC) 2.5 MG tablet TAKE 1 TABLET (2.5 MG TOTAL) BY MOUTH 2 (TWO) TIMES DAILY. 60 tablet 0  . aspirin 81 MG EC tablet Take 81 mg by mouth daily.      [name] atorvastatin (LIPITOR) 10 MG tablet Take 1 tablet by mouth daily.    . clopidogrel (PLAVIX) 75 MG tablet Take 1 tablet (75 mg total) by mouth daily. 90 tablet 3  . Fluticasone-Salmeterol (ADVAIR DISKUS) 250-50 MCG/DOSE AEPB Inhale 1 puff into the lungs 2 (two) times daily.    . Folic Acid-Vit B6-Vit B12 (FOLBEE) 2.5-25-1 MG TABS Take 1 tablet by mouth daily.      [name] gabapentin (NEURONTIN) 300 MG capsule Take 1 capsule by mouth daily.    [name] levothyroxine (SYNTHROID, LEVOTHROID) 75 MCG tablet Take 75 mcg by mouth daily before breakfast.    . loratadine (CLARITIN) 10 MG tablet Take 10 mg by mouth daily.      . meclizine (ANTIVERT) 12.5 MG tablet Take 12.5 mg by mouth 3 (three) times daily as needed for dizziness.     . Multiple Vitamin (MULTIVITAMIN) tablet Take 1 tablet by mouth daily.      . Multiple Vitamins-Minerals (MH MACULAR HEALTH PO) Take 1 capsule by mouth 2 (two) times daily.    . nitroGLYCERIN (NITROSTAT) 0.4 MG SL tablet Place 0.4 mg under the tongue every 5 (five) minutes as needed.      . prednisoLONE acetate (PRED MILD) 0.12 %  ophthalmic suspension Place 1 drop into the right eye 3 (three) times daily.     [name] Specialty Vitamins Products (PROSTATE) TABS Take 1 tablet by mouth daily.     . tamsulosin (FLOMAX) 0.4 MG CAPS capsule Take 1 capsule (0.4 mg total) by mouth daily. 30 capsule 0   No current facility-administered medications for this visit.    Past Medical History  Diagnosis Date  . HLD (hyperlipidemia)   . Abnormal EKG     left ventricular hypertrophy and secondary repolarization changes   . HTN (hypertension)     boarderline control   . Thoracic aortic aneurysm   . CAD in native artery     with NSTEMI and stent to circumflex  coronary artery 3/11. nml left ventricular function   . PVD (peripheral vascular disease)     with thoracic abd aneurysm; follwed by Dr. Earley Brooke. signif. enlarged measuring 6 cm  . Dyslipidemia   . Abdominal aortic aneurysm     Followed by vascular surgery  . Myocardial infarction 2007  . Macular degeneration   . Spinal stenosis     pt.s daughter states about 2 weeks ago 10-09-2011 had "cortisone injection  in his back"    Past Surgical History  Procedure Laterality Date  . Appendectomy    . Removal of bone chip  1995    right arm   . Appendectomy    . Right arm nerve transfer  1989  . Cataract extraction  1998 &2004  . Joint replacement  2009    Hip  . Enucleation  2012    Left eye removal    Social History   Social History  . Marital Status: Married    Spouse Name: N/A  . Number of Children: N/A  . Years of Education: N/A   Occupational History  . Not on file.   Social History Main Topics  . Smoking status: Former Smoker -- 1.00 packs/day for 35 years    Types: Cigarettes    Start date: 07/02/1931    Quit date: 08/07/1967  . Smokeless tobacco: Never Used  . Alcohol Use: No  . Drug Use: No  . Sexual Activity: Not on file   Other Topics Concern  . Not on file   Social History Narrative   Married, retired [employer] vet     Filed Vitals:   03/30/15 1137  BP: 121/73  Pulse: 64  Height:  (1.676 m)  Weight: 133 lb (60.328 kg)    PHYSICAL EXAM General: NAD HEENT: Hard of hearing. 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 Neck: No JVD, no thyromegaly. Lungs: Diminished with faint end-expiratory wheezes, no rales. CV: Nondisplaced PMI.  Regular rate and rhythm, normal S1/S2, no S3/S4, no murmur. No pretibial or periankle edema.     Abdomen: Soft, no distention.  Neurologic: Alert.  Psych: Normal affect. Skin: Normal. Musculoskeletal: No gross deformities. Extremities: No clubbing or cyanosis.   ECG: Most recent ECG reviewed.      ASSESSMENT AND PLAN: 1. CAD: Stable  ischemic heart disease. Continue ASA, Plavix, and Lipitor.   2. Paroxysmal atrial fibrillation: Asymptomatic. No changes to meds.  3. Essential HTN: Well controlled on amlodipine.   4. PVD: No further workup regarding his aneurysm.   Dispo: f/u 6 months.   Prentice Docker, M.D., F.A.C.C.

## 2015-03-30 NOTE — Patient Instructions (Signed)
Continue all current medications. Your physician wants you to follow up in: 6 months.  You will receive a reminder letter in the mail one-two months in advance.  If you don't receive a letter, please call our office to schedule the follow up appointment   

## 2015-10-02 ENCOUNTER — Encounter (HOSPITAL_COMMUNITY): Payer: Self-pay | Admitting: Emergency Medicine

## 2015-10-02 ENCOUNTER — Emergency Department (HOSPITAL_COMMUNITY): Payer: Medicare Other

## 2015-10-02 ENCOUNTER — Inpatient Hospital Stay (HOSPITAL_COMMUNITY)
Admission: EM | Admit: 2015-10-02 | Discharge: 2015-10-03 | DRG: 683 | Disposition: A | Discharge status: Home Health | Payer: Medicare Other | Attending: Internal Medicine | Admitting: Internal Medicine

## 2015-10-02 ENCOUNTER — Observation Stay (HOSPITAL_COMMUNITY): Payer: Medicare Other

## 2015-10-02 DIAGNOSIS — Z7982 Long term (current) use of aspirin: Secondary | ICD-10-CM

## 2015-10-02 DIAGNOSIS — Z7902 Long term (current) use of antithrombotics/antiplatelets: Secondary | ICD-10-CM

## 2015-10-02 DIAGNOSIS — Z955 Presence of coronary angioplasty implant and graft: Secondary | ICD-10-CM

## 2015-10-02 DIAGNOSIS — E039 Hypothyroidism, unspecified: Secondary | ICD-10-CM | POA: Diagnosis present

## 2015-10-02 DIAGNOSIS — Z8249 Family history of ischemic heart disease and other diseases of the circulatory system: Secondary | ICD-10-CM

## 2015-10-02 DIAGNOSIS — Z87891 Personal history of nicotine dependence: Secondary | ICD-10-CM | POA: Diagnosis not present

## 2015-10-02 DIAGNOSIS — E860 Dehydration: Secondary | ICD-10-CM | POA: Diagnosis present

## 2015-10-02 DIAGNOSIS — Z79899 Other long term (current) drug therapy: Secondary | ICD-10-CM

## 2015-10-02 DIAGNOSIS — Z6820 Body mass index (BMI) 20.0-20.9, adult: Secondary | ICD-10-CM

## 2015-10-02 DIAGNOSIS — I129 Hypertensive chronic kidney disease with stage 1 through stage 4 chronic kidney disease, or unspecified chronic kidney disease: Secondary | ICD-10-CM | POA: Diagnosis present

## 2015-10-02 DIAGNOSIS — I739 Peripheral vascular disease, unspecified: Secondary | ICD-10-CM

## 2015-10-02 DIAGNOSIS — F0390 Unspecified dementia without behavioral disturbance: Secondary | ICD-10-CM | POA: Diagnosis present

## 2015-10-02 DIAGNOSIS — I480 Paroxysmal atrial fibrillation: Secondary | ICD-10-CM | POA: Diagnosis present

## 2015-10-02 DIAGNOSIS — E440 Moderate protein-calorie malnutrition: Secondary | ICD-10-CM | POA: Diagnosis present

## 2015-10-02 DIAGNOSIS — Z66 Do not resuscitate: Secondary | ICD-10-CM | POA: Diagnosis present

## 2015-10-02 DIAGNOSIS — R42 Dizziness and giddiness: Secondary | ICD-10-CM | POA: Diagnosis not present

## 2015-10-02 DIAGNOSIS — S51812A Laceration without foreign body of left forearm, initial encounter: Secondary | ICD-10-CM | POA: Diagnosis present

## 2015-10-02 DIAGNOSIS — Y92009 Unspecified place in unspecified non-institutional (private) residence as the place of occurrence of the external cause: Secondary | ICD-10-CM | POA: Diagnosis not present

## 2015-10-02 DIAGNOSIS — R0902 Hypoxemia: Secondary | ICD-10-CM | POA: Diagnosis present

## 2015-10-02 DIAGNOSIS — S2232XA Fracture of one rib, left side, initial encounter for closed fracture: Secondary | ICD-10-CM | POA: Diagnosis present

## 2015-10-02 DIAGNOSIS — R778 Other specified abnormalities of plasma proteins: Secondary | ICD-10-CM

## 2015-10-02 DIAGNOSIS — H3530 Unspecified macular degeneration: Secondary | ICD-10-CM | POA: Diagnosis present

## 2015-10-02 DIAGNOSIS — R52 Pain, unspecified: Secondary | ICD-10-CM

## 2015-10-02 DIAGNOSIS — N189 Chronic kidney disease, unspecified: Secondary | ICD-10-CM

## 2015-10-02 DIAGNOSIS — I716 Thoracoabdominal aortic aneurysm, without rupture, unspecified: Secondary | ICD-10-CM | POA: Diagnosis present

## 2015-10-02 DIAGNOSIS — I2510 Atherosclerotic heart disease of native coronary artery without angina pectoris: Secondary | ICD-10-CM | POA: Diagnosis present

## 2015-10-02 DIAGNOSIS — N179 Acute kidney failure, unspecified: Secondary | ICD-10-CM | POA: Diagnosis not present

## 2015-10-02 DIAGNOSIS — H9190 Unspecified hearing loss, unspecified ear: Secondary | ICD-10-CM | POA: Diagnosis present

## 2015-10-02 DIAGNOSIS — Z806 Family history of leukemia: Secondary | ICD-10-CM | POA: Diagnosis not present

## 2015-10-02 DIAGNOSIS — W19XXXA Unspecified fall, initial encounter: Secondary | ICD-10-CM | POA: Diagnosis present

## 2015-10-02 DIAGNOSIS — E785 Hyperlipidemia, unspecified: Secondary | ICD-10-CM | POA: Diagnosis present

## 2015-10-02 DIAGNOSIS — S20219A Contusion of unspecified front wall of thorax, initial encounter: Secondary | ICD-10-CM | POA: Insufficient documentation

## 2015-10-02 DIAGNOSIS — S20212A Contusion of left front wall of thorax, initial encounter: Secondary | ICD-10-CM

## 2015-10-02 DIAGNOSIS — I252 Old myocardial infarction: Secondary | ICD-10-CM | POA: Diagnosis not present

## 2015-10-02 DIAGNOSIS — Z96643 Presence of artificial hip joint, bilateral: Secondary | ICD-10-CM | POA: Diagnosis present

## 2015-10-02 DIAGNOSIS — N183 Chronic kidney disease, stage 3 (moderate): Secondary | ICD-10-CM | POA: Diagnosis present

## 2015-10-02 DIAGNOSIS — I10 Essential (primary) hypertension: Secondary | ICD-10-CM | POA: Diagnosis present

## 2015-10-02 DIAGNOSIS — Z823 Family history of stroke: Secondary | ICD-10-CM

## 2015-10-02 DIAGNOSIS — R7989 Other specified abnormal findings of blood chemistry: Secondary | ICD-10-CM

## 2015-10-02 DIAGNOSIS — I4891 Unspecified atrial fibrillation: Secondary | ICD-10-CM | POA: Diagnosis present

## 2015-10-02 DIAGNOSIS — Z7951 Long term (current) use of inhaled steroids: Secondary | ICD-10-CM

## 2015-10-02 LAB — URINALYSIS, ROUTINE W REFLEX MICROSCOPIC
Bilirubin Urine: NEGATIVE
GLUCOSE, UA: NEGATIVE mg/dL
HGB URINE DIPSTICK: NEGATIVE
KETONES UR: NEGATIVE mg/dL
Leukocytes, UA: NEGATIVE
Nitrite: NEGATIVE
PH: 5.5 (ref 5.0–8.0)
PROTEIN: 100 mg/dL — AB
Specific Gravity, Urine: 1.017 (ref 1.005–1.030)

## 2015-10-02 LAB — CBC WITH DIFFERENTIAL/PLATELET
Basophils Absolute: 0 10*3/uL (ref 0.0–0.1)
Basophils Relative: 0 %
EOS ABS: 0.1 10*3/uL (ref 0.0–0.7)
EOS PCT: 2 %
HCT: 36.8 % — ABNORMAL LOW (ref 39.0–52.0)
Hemoglobin: 12.3 g/dL — ABNORMAL LOW (ref 13.0–17.0)
LYMPHS ABS: 1.6 10*3/uL (ref 0.7–4.0)
Lymphocytes Relative: 22 %
MCH: 31 pg (ref 26.0–34.0)
MCHC: 33.4 g/dL (ref 30.0–36.0)
MCV: 92.7 fL (ref 78.0–100.0)
MONOS PCT: 12 %
Monocytes Absolute: 0.8 10*3/uL (ref 0.1–1.0)
Neutro Abs: 4.6 10*3/uL (ref 1.7–7.7)
Neutrophils Relative %: 64 %
PLATELETS: 150 10*3/uL (ref 150–400)
RBC: 3.97 MIL/uL — ABNORMAL LOW (ref 4.22–5.81)
RDW: 14.4 % (ref 11.5–15.5)
WBC: 7.2 10*3/uL (ref 4.0–10.5)

## 2015-10-02 LAB — URINE MICROSCOPIC-ADD ON: Bacteria, UA: NONE SEEN

## 2015-10-02 LAB — BASIC METABOLIC PANEL
Anion gap: 12 (ref 5–15)
BUN: 28 mg/dL — AB (ref 6–20)
CHLORIDE: 107 mmol/L (ref 101–111)
CO2: 22 mmol/L (ref 22–32)
CREATININE: 2.12 mg/dL — AB (ref 0.61–1.24)
Calcium: 9.3 mg/dL (ref 8.9–10.3)
GFR calc Af Amer: 29 mL/min — ABNORMAL LOW (ref >60)
GFR calc non Af Amer: 25 mL/min — ABNORMAL LOW (ref >60)
GLUCOSE: 100 mg/dL — AB (ref 65–99)
Potassium: 4.1 mmol/L (ref 3.5–5.1)
Sodium: 141 mmol/L (ref 135–145)

## 2015-10-02 LAB — TROPONIN I
TROPONIN I: 0.05 ng/mL — AB (ref <0.031)
Troponin I: 0.05 ng/mL — ABNORMAL HIGH (ref <0.031)

## 2015-10-02 MED ORDER — SODIUM CHLORIDE 0.9% FLUSH
3.0000 mL | Freq: Two times a day (BID) | INTRAVENOUS | Status: DC
  Administered 2015-10-02 – 2015-10-03 (×2): 3 mL via INTRAVENOUS

## 2015-10-02 MED ORDER — SODIUM CHLORIDE 0.9 % IV SOLN
Freq: Once | INTRAVENOUS | Status: AC
  Administered 2015-10-02: 11:00:00 via INTRAVENOUS

## 2015-10-02 MED ORDER — ASPIRIN 81 MG PO TBEC: 81.0000 mg | DELAYED_RELEASE_TABLET | Freq: Every day | ORAL | Status: DC

## 2015-10-02 MED ORDER — ADULT MULTIVITAMIN W/MINERALS CH
1.0000 | ORAL_TABLET | Freq: Every day | ORAL | Status: DC
  Administered 2015-10-02 – 2015-10-03 (×2): 1 via ORAL
  Filled 2015-10-02 (×2): qty 1

## 2015-10-02 MED ORDER — SODIUM CHLORIDE 0.9 % IV SOLN
INTRAVENOUS | Status: DC
  Administered 2015-10-02: 21:00:00 via INTRAVENOUS

## 2015-10-02 MED ORDER — ONE-DAILY MULTI VITAMINS PO TABS: 1.0000 | ORAL_TABLET | Freq: Every day | ORAL | Status: DC

## 2015-10-02 MED ORDER — LEVOTHYROXINE SODIUM 75 MCG PO TABS
75.0000 ug | ORAL_TABLET | Freq: Every day | ORAL | Status: DC
  Administered 2015-10-03: 75 ug via ORAL
  Filled 2015-10-02: qty 1

## 2015-10-02 MED ORDER — TRAMADOL-ACETAMINOPHEN 37.5-325 MG PO TABS: 2.0000 | ORAL_TABLET | Freq: Four times a day (QID) | ORAL | Status: DC | PRN

## 2015-10-02 MED ORDER — ALBUTEROL SULFATE (2.5 MG/3ML) 0.083% IN NEBU: 2.5000 mg | INHALATION_SOLUTION | Freq: Four times a day (QID) | RESPIRATORY_TRACT | Status: DC | PRN

## 2015-10-02 MED ORDER — IPRATROPIUM-ALBUTEROL 0.5-2.5 (3) MG/3ML IN SOLN
3.0000 mL | Freq: Once | RESPIRATORY_TRACT | Status: AC
  Administered 2015-10-02: 3 mL via RESPIRATORY_TRACT
  Filled 2015-10-02: qty 3

## 2015-10-02 MED ORDER — FENTANYL CITRATE (PF) 100 MCG/2ML IJ SOLN
25.0000 ug | INTRAMUSCULAR | Status: DC | PRN
  Administered 2015-10-02: 25 ug via INTRAVENOUS
  Filled 2015-10-02: qty 2

## 2015-10-02 MED ORDER — ATORVASTATIN CALCIUM 10 MG PO TABS
10.0000 mg | ORAL_TABLET | Freq: Every day | ORAL | Status: DC
  Administered 2015-10-02 – 2015-10-03 (×2): 10 mg via ORAL
  Filled 2015-10-02 (×2): qty 1

## 2015-10-02 MED ORDER — NITROGLYCERIN 0.4 MG SL SUBL: 0.4000 mg | SUBLINGUAL_TABLET | SUBLINGUAL | Status: DC | PRN

## 2015-10-02 MED ORDER — ACETAMINOPHEN 650 MG RE SUPP
650.0000 mg | Freq: Four times a day (QID) | RECTAL | Status: DC | PRN
Start: 2015-10-02 — End: 2015-10-03

## 2015-10-02 MED ORDER — ACETAMINOPHEN 325 MG PO TABS
650.0000 mg | ORAL_TABLET | Freq: Four times a day (QID) | ORAL | Status: DC | PRN
Start: 2015-10-02 — End: 2015-10-03

## 2015-10-02 MED ORDER — GABAPENTIN 300 MG PO CAPS
300.0000 mg | ORAL_CAPSULE | Freq: Every day | ORAL | Status: DC
  Administered 2015-10-02 – 2015-10-03 (×2): 300 mg via ORAL
  Filled 2015-10-02 (×2): qty 1

## 2015-10-02 MED ORDER — ALBUTEROL SULFATE (2.5 MG/3ML) 0.083% IN NEBU: 3.0000 mL | INHALATION_SOLUTION | Freq: Four times a day (QID) | RESPIRATORY_TRACT | Status: DC | PRN

## 2015-10-02 MED ORDER — FOLIC ACID-VIT B6-VIT B12 2.5-25-1 MG PO TABS
1.0000 | ORAL_TABLET | Freq: Every day | ORAL | Status: DC
  Administered 2015-10-03: 1 via ORAL
  Filled 2015-10-02 (×3): qty 1

## 2015-10-02 MED ORDER — CLOPIDOGREL BISULFATE 75 MG PO TABS
75.0000 mg | ORAL_TABLET | Freq: Every day | ORAL | Status: DC
  Administered 2015-10-02 – 2015-10-03 (×2): 75 mg via ORAL
  Filled 2015-10-02 (×2): qty 1

## 2015-10-02 MED ORDER — PROSTATE PO TABS: 1.0000 | ORAL_TABLET | Freq: Every day | ORAL | Status: DC

## 2015-10-02 MED ORDER — ONDANSETRON HCL 4 MG PO TABS: 4.0000 mg | ORAL_TABLET | Freq: Four times a day (QID) | ORAL | Status: DC | PRN

## 2015-10-02 MED ORDER — MECLIZINE HCL 25 MG PO TABS: 12.5000 mg | ORAL_TABLET | Freq: Three times a day (TID) | ORAL | Status: DC | PRN

## 2015-10-02 MED ORDER — AMLODIPINE BESYLATE 2.5 MG PO TABS
2.5000 mg | ORAL_TABLET | Freq: Two times a day (BID) | ORAL | Status: DC
  Administered 2015-10-02 – 2015-10-03 (×2): 2.5 mg via ORAL
  Filled 2015-10-02 (×2): qty 1

## 2015-10-02 MED ORDER — ASPIRIN EC 81 MG PO TBEC
81.0000 mg | DELAYED_RELEASE_TABLET | Freq: Every day | ORAL | Status: DC
  Administered 2015-10-02 – 2015-10-03 (×2): 81 mg via ORAL
  Filled 2015-10-02 (×2): qty 1

## 2015-10-02 MED ORDER — HEPARIN SODIUM (PORCINE) 5000 UNIT/ML IJ SOLN
5000.0000 [IU] | Freq: Three times a day (TID) | INTRAMUSCULAR | Status: DC
  Administered 2015-10-02 – 2015-10-03 (×2): 5000 [IU] via SUBCUTANEOUS
  Filled 2015-10-02 (×2): qty 1

## 2015-10-02 MED ORDER — MOMETASONE FURO-FORMOTEROL FUM 200-5 MCG/ACT IN AERO
2.0000 | INHALATION_SPRAY | Freq: Two times a day (BID) | RESPIRATORY_TRACT | Status: DC
Start: 2015-10-02 — End: 2015-10-03
  Filled 2015-10-02 (×2): qty 8.8

## 2015-10-02 MED ORDER — FENTANYL CITRATE (PF) 100 MCG/2ML IJ SOLN
12.5000 ug | INTRAMUSCULAR | Status: DC | PRN
Start: 2015-10-02 — End: 2015-10-03

## 2015-10-02 MED ORDER — LORATADINE 10 MG PO TABS
10.0000 mg | ORAL_TABLET | Freq: Every day | ORAL | Status: DC
  Administered 2015-10-02 – 2015-10-03 (×2): 10 mg via ORAL
  Filled 2015-10-02 (×2): qty 1

## 2015-10-02 MED ORDER — ONDANSETRON HCL 4 MG/2ML IJ SOLN: 4.0000 mg | Freq: Four times a day (QID) | INTRAMUSCULAR | Status: DC | PRN

## 2015-10-02 MED ORDER — LIDOCAINE 5 % EX PTCH
2.0000 | MEDICATED_PATCH | CUTANEOUS | Status: DC
  Administered 2015-10-02: 2 via TRANSDERMAL
  Filled 2015-10-02: qty 2

## 2015-10-02 NOTE — ED Notes (Signed)
Pt arrives from home reporting fall today.  Pt's daughter reports pt reported feeling dizzy before falling.  Pt reports LUE, LLE, L hip pain.  Pt takes Plavix.

## 2015-10-02 NOTE — H&P (Signed)
[facility] History and Physical  [name] [name] QMV:[id number] DOB: 08/03/1920 DOA: 10/02/2015  Referring physician: Dr. Jodi Mourning PCP: Josue Hector, MD   Chief Complaint:  Fall at home, dehydration,  HPI:  82 year old male with history of CAD with NSTE MI, PVD, thoracic and abdominal aortic aneurysm (follows with Dr. [name]), hypertension, hyperlipidemia, CKD 3, hypothyroidism, mild dementia and hard of hearing was admitted in January for chest pain and managed medically, who lives at home with his wife and his 2 daughters close by had a fall at home and landed on his left side. He hit his left hip and left lateral ribs hard on the floor when he was going to the bathroom. Denies any loss of consciousness but reported feeling dizzy and may have tripped onto something. History is mainly provided by patient's daughters at bedside as patient is very hard of hearing and was infused after receiving pain medications in the ED. At baseline patient ambulates short distances in his house mostly using a walker. Daughters report that last few days his appetite was also poor. No history of fever, chills, nausea, vomiting, shortness of breath, abdominal pain, bowel or urinary symptoms. In the ED, patient was found to be hypoxic to 82% on room air. Improved on 2 L via nasal cannula. Next line blood work showed hemoglobin of 12.3, acute on chronic kidney disease with elevated BUN of 28 and creatinine of 2.12. Troponin was mildly elevated to 0.05. KG showed normal sinus rhythm with diffuse T-wave inversion (no changes) and prolonged QTC of 508. X-ray of the hip was negative for any fracture. X-ray of the forearm and wrist were unremarkable for fracture as well. Chest x-ray was unremarkable except for some atelectasis. Patient however had significant pain and tenderness in his left ribs and was hypoxic on room air so CT of the chest without contrast was done which showed an acute nondisplaced fracture of  the left sixth and seventh ribs with no pneumothorax or contusion. She was given a dose of IV fentanyl but still had pain and hypoxic to 84% on room air. Hospitalists admission requested for uncontrollable pain due to left rib fractures, left hip pain and acute on chronic kidney disease secondary to dehydration.   Review of Systems:  As outlined in history of present illness. 12 point review of systems limited as patient unable to provide detailed history.   Past Medical History  Diagnosis Date  . HLD (hyperlipidemia)   . Abnormal EKG     left ventricular hypertrophy and secondary repolarization changes   . HTN (hypertension)     boarderline control   . Thoracic aortic aneurysm (HCC)   . CAD in native artery     with NSTEMI and stent to circumflex coronary artery 3/11. nml left ventricular function   . PVD (peripheral vascular disease) (HCC)     with thoracic abd aneurysm; follwed by Dr. Earley Brooke. signif. enlarged measuring 6 cm  . Dyslipidemia   . Abdominal aortic aneurysm (HCC)     Followed by vascular surgery  . Myocardial infarction (HCC) 2007  . Macular degeneration   . Spinal stenosis     pt.s daughter states about 2 weeks ago 10-09-2011 had "cortisone injection  in his back"   Past Surgical History  Procedure Laterality Date  . Appendectomy    . Removal of bone chip  1995    right arm   . Appendectomy    . Right arm nerve transfer  1989  . Cataract extraction  1998 &2004  . Joint replacement  2009    Hip  . Enucleation  2012    Left eye removal   Social History:  reports that he quit smoking about 48 years ago. His smoking use included Cigarettes. He started smoking about 84 years ago. He has a 35 pack-year smoking history. He has never used smokeless tobacco. He reports that he does not drink alcohol or use illicit drugs.  Allergies  Allergen Reactions  . Ciprofloxacin     REACTION: swelling of joints,mouth  . Tamsulosin Hcl Other (See Comments)    Lower Back  pain    Family History  Problem Relation Age of Onset  . Cancer      fhx  . Stroke      fhx  . Coronary artery disease      fhx   . Heart disease Mother     angina  . Cancer Father     leukemia  . Heart disease Father     Prior to Admission medications   Medication Sig Start Date End Date Taking? Authorizing Provider  albuterol (PROVENTIL HFA;VENTOLIN HFA) 108 (90 BASE) MCG/ACT inhaler Inhale 2 puffs into the lungs every 6 (six) hours as needed for wheezing or shortness of breath.    Yes Historical Provider, MD  amLODipine (NORVASC) 2.5 MG tablet Take 1 tablet (2.5 mg total) by mouth 2 (two) times daily. 03/30/15  Yes Laqueta Linden, MD  aspirin 81 MG EC tablet Take 81 mg by mouth daily.     Yes Historical Provider, MD  atorvastatin (LIPITOR) 10 MG tablet Take 1 tablet by mouth daily. 03/07/14  Yes Historical Provider, MD  clopidogrel (PLAVIX) 75 MG tablet Take 1 tablet (75 mg total) by mouth daily. 03/30/15  Yes Laqueta Linden, MD  Fluticasone-Salmeterol (ADVAIR DISKUS) 250-50 MCG/DOSE AEPB Inhale 1 puff into the lungs 2 (two) times daily. 01/01/14  Yes Historical Provider, MD  Folic Acid-Vit B6-Vit B12 (FOLBEE) 2.5-25-1 MG TABS Take 1 tablet by mouth daily.     Yes Historical Provider, MD  gabapentin (NEURONTIN) 300 MG capsule Take 1 capsule by mouth daily. 01/26/14  Yes Historical Provider, MD  levothyroxine (SYNTHROID, LEVOTHROID) 75 MCG tablet Take 75 mcg by mouth daily before breakfast.   Yes Historical Provider, MD  loratadine (CLARITIN) 10 MG tablet Take 10 mg by mouth daily.     Yes Historical Provider, MD  meclizine (ANTIVERT) 12.5 MG tablet Take 12.5 mg by mouth 3 (three) times daily as needed for dizziness.  03/24/13  Yes Historical Provider, MD  Multiple Vitamin (MULTIVITAMIN) tablet Take 1 tablet by mouth daily.     Yes Historical Provider, MD  Multiple Vitamins-Minerals (MH MACULAR HEALTH PO) Take 1 capsule by mouth 2 (two) times daily.   Yes Historical Provider, MD   nitroGLYCERIN (NITROSTAT) 0.4 MG SL tablet Place 0.4 mg under the tongue every 5 (five) minutes as needed.     Yes Historical Provider, MD  Specialty Vitamins Products (PROSTATE) TABS Take 1 tablet by mouth daily.    Yes Historical Provider, MD     Physical Exam:  Filed Vitals:   10/02/15 1300 10/02/15 1313 10/02/15 1345 10/02/15 1415  BP: 137/77 137/77 133/93 150/94  Pulse: 68 77 75 78  Temp:      TempSrc:      Resp: 16 18    SpO2: 95% 91% 86% 93%    Constitutional: Vital signs reviewed.  Elderly male hard of hearing, in some distress with pain  HEENT: Enucleated left eye, no pallor, dry oral mucosa, no cervical lymphadenopathy Cardiovascular: RRR, S1 normal, S2 normal, no MRG Chest: Coarse crackles over left lung base, limited inspiratory effort, tender to pressure over left lateral chest Abdominal: Soft. Non-tender, non-distended, bowel sounds are normal,  Ext: warm, no edema, limited mobility of the left hip due to pain  Neurological: Alert and awake, oriented 1  Labs on Admission:  Basic Metabolic Panel:  Recent Labs Lab 10/02/15 1037  NA 141  K 4.1  CL 107  CO2 22  GLUCOSE 100*  BUN 28*  CREATININE 2.12*  CALCIUM 9.3   Liver Function Tests: No results for input(s): AST, ALT, ALKPHOS, BILITOT, PROT, ALBUMIN in the last 168 hours. No results for input(s): LIPASE, AMYLASE in the last 168 hours. No results for input(s): AMMONIA in the last 168 hours. CBC:  Recent Labs Lab 10/02/15 1037  WBC 7.2  NEUTROABS 4.6  HGB 12.3*  HCT 36.8*  MCV 92.7  PLT 150   Cardiac Enzymes:  Recent Labs Lab 10/02/15 1037 10/02/15 1421  TROPONINI 0.05* 0.05*   BNP: Invalid input(s): POCBNP CBG: No results for input(s): GLUCAP in the last 168 hours.  Radiological Exams on Admission: Dg Chest 1 View  10/02/2015  CLINICAL DATA:  Fall.  Left wrist and femur pain. EXAM: CHEST 1 VIEW COMPARISON:  08/19/2014 FINDINGS: There is hyperinflation of the lungs compatible with  COPD. Scarring at the left base. No acute airspace opacities or effusions. No acute bony abnormality. No pneumothorax. IMPRESSION: COPD.  Left basilar scarring.  No active disease. Electronically Signed   By: Charlett Nose M.D.   On: 10/02/2015 11:45   Dg Forearm Left  10/02/2015  CLINICAL DATA:  82 year old who became dizzy and fell at home earlier today, sustaining a laceration to the left forearm. Initial encounter. EXAM: LEFT FOREARM - 2 VIEW COMPARISON:  None. FINDINGS: Soft tissue swelling/hematoma involving the subcutaneous tissues of the volar aspect of the mid forearm. No acute fractures involving the radius or ulna. Visualized elbow joint and wrist joint intact with mild degenerative changes. Generalized osseous demineralization. IMPRESSION: No acute osseous abnormality.  Osseous demineralization. Electronically Signed   By: Hulan Saas M.D.   On: 10/02/2015 11:51   Dg Wrist Complete Left  10/02/2015  CLINICAL DATA:  Fall.  Left wrist pain. EXAM: LEFT WRIST - COMPLETE 3+ VIEW COMPARISON:  None. FINDINGS: Degenerative changes within the left wrist. No acute bony abnormality. Specifically, no fracture, subluxation, or dislocation. Soft tissues are intact. IMPRESSION: No acute bony abnormality. Electronically Signed   By: Charlett Nose M.D.   On: 10/02/2015 11:44   Ct Chest Wo Contrast  10/02/2015  CLINICAL DATA:  82 year old male with fall, hypoxia and left chest pain. History of thoracic aortic aneurysm. EXAM: CT CHEST WITHOUT CONTRAST TECHNIQUE: Multidetector CT imaging of the chest was performed following the standard protocol without IV contrast. COMPARISON:  10/24/2011 and prior exams. FINDINGS: Mediastinum/Nodes: Coronary calcifications again noted. A distal thoracic aortic aneurysm measuring 4.4 cm and greatest diameter is unchanged. Ectasia of the ascending aorta is noted measuring 3.8 cm in greatest diameter. There is no evidence of enlarged lymph nodes or pericardial effusion.  Lungs/Pleura: Moderate to severe centrilobular emphysema is noted. There is no evidence of mass, airspace disease, suspicious nodule, consolidation or endobronchial/endotracheal lesion. No pleural effusion or pneumothorax identified. Upper abdomen: No acute abnormality Musculoskeletal: Acute nondisplaced fractures of lateral left sixth and seventh ribs noted. Mild degenerative disc disease within the thoracic spine  noted. IMPRESSION: Acute nondisplaced fractures of the lateral left sixth seventh ribs. No evidence of pleural effusion or pneumothorax. Unchanged 4.4 cm distal thoracic aortic aneurysm and ascending thoracic aortic ectasia. Coronary artery disease and moderate to severe emphysema. Electronically Signed   By: Harmon Pier M.D.   On: 10/02/2015 15:34   Dg Pelvis Portable  10/02/2015  CLINICAL DATA:  82 year old who fell at home earlier today and complains of left hip pain. EXAM: PORTABLE PELVIS 1-2 VIEWS COMPARISON:  Left hip images obtained concurrently. FINDINGS: Bilateral total hip arthroplasties with anatomic alignment and no complicating features. Osseous demineralization. No acute fractures involving the bony pelvis or either proximal femur. Sacroiliac joints and symphysis pubis intact. Mild degenerative changes involving the visualized lower lumbar spine. IMPRESSION: No acute osseous abnormality. Osseous demineralization. Bilateral total hip arthroplasties with anatomic alignment and no complicating features. Electronically Signed   By: Hulan Saas M.D.   On: 10/02/2015 13:23   Dg Femur Min 2 Views Left  10/02/2015  CLINICAL DATA:  Fall, left femur pain EXAM: LEFT FEMUR 2 VIEWS COMPARISON:  None. FINDINGS: Prior left hip replacement. No acute bony abnormality. Specifically, no fracture, subluxation, or dislocation. Soft tissues are intact. IMPRESSION: No acute bony abnormality. Electronically Signed   By: Charlett Nose M.D.   On: 10/02/2015 11:46    EKG: Normal sinus rhythm at 59,  diffuse T-wave inversions (unchanged from prior), prolonged QTC at 508  Assessment/Plan Principal Problem:   Acute-on-chronic kidney injury (HCC) Suspected due to dehydration as family report patient had or by mouth intake last few days. Admit to telemetry. IV hydration with normal saline. UA negative for UTI. Avoid NSAIDs. Monitor in a.m.  Active Problems:   Fall with left lateral rib fractures and left hip pain Pain control with when necessary Ultracet and IV fentanyl. Minimize narcotics as patient gets very confused. Ordered Lidoderm patch. Bedside spirometry every 2 hours. Continue O2 via nasal cannula. order when necessary albuterol nebulizer. PT evaluation.  CAD with stable ischemic heart disease Continue aspirin, Plavix and Lipitor. Has mildly elevated troponin which I suspect could be from trauma. No chest pain symptoms or EKG changes.  Paroxysmal A. fib In sinus rhythm. Continue aspirin  Essential hypertension Continue home medication     Thoraco abdominal aneurysm [facility]) Patient has a 6 cm AAA and a 4 cm thoracic-aortic aneurysm. Follows with vascular surgery Dr. [name]     PVD (peripheral vascular disease) (HCC) Continue Plavix and statin. Follows with Dr. [name].    Hypothyroidism Continue Synthroid.    Moderate protein-calorie malnutrition [facility]) Dietitian consulted    Diet:regular  DVT prophylaxis: sq heparin   Code Status:  DNR Family Communication : daughters at bedside  Eddie North [facility] Pager [phone]  Total time spent on admission :60 minutes  If 7PM-7AM, please contact night-coverage [email or web address] Password [email or web address] 10/02/2015, 4:11 PM

## 2015-10-02 NOTE — ED Notes (Signed)
Attempted to call report

## 2015-10-02 NOTE — ED Provider Notes (Signed)
CSN: [id number]     Arrival date & time 10/02/15  7829 History   First MD Initiated Contact with Patient 10/02/15 [phone]     Chief Complaint  Patient presents with  . Fall     (Consider location/radiation/quality/duration/timing/severity/associated sxs/prior Treatment) HPI Comments: 82 year old male with difficulty hearing, abdominal aneurysm, atrial fibrillation, CAD, vascular disease, hypothyroidism, lives with family with support throughout the day presents after fall. Patient recalls falling felt a little dizzy prior to falling and landed on his left side. Patient has swelling and tenderness left forearm and left hip with mild deformity. Patient has followed with Dr. Charlann Boxer in the past for his right femur. Patient is pain with range of motion left hip. No syncope. Patient denies neurologic symptoms. No recent blood in the stools. Patient denies head injury.   Patient is a 82 y.o. male presenting with fall. The history is provided by the patient, a relative and medical records.  Fall Pertinent negatives include no chest pain, no abdominal pain, no headaches and no shortness of breath.    Past Medical History  Diagnosis Date  . HLD (hyperlipidemia)   . Abnormal EKG     left ventricular hypertrophy and secondary repolarization changes   . HTN (hypertension)     boarderline control   . Thoracic aortic aneurysm (HCC)   . CAD in native artery     with NSTEMI and stent to circumflex coronary artery 3/11. nml left ventricular function   . PVD (peripheral vascular disease) (HCC)     with thoracic abd aneurysm; follwed by Dr. Earley Brooke. signif. enlarged measuring 6 cm  . Dyslipidemia   . Abdominal aortic aneurysm (HCC)     Followed by vascular surgery  . Myocardial infarction (HCC) 2007  . Macular degeneration   . Spinal stenosis     pt.s daughter states about 2 weeks ago 10-09-2011 had "cortisone injection  in his back"   Past Surgical History  Procedure Laterality Date  . Appendectomy     . Removal of bone chip  1995    right arm   . Appendectomy    . Right arm nerve transfer  1989  . Cataract extraction  1998 &2004  . Joint replacement  2009    Hip  . Enucleation  2012    Left eye removal   Family History  Problem Relation Age of Onset  . Cancer      fhx  . Stroke      fhx  . Coronary artery disease      fhx   . Heart disease Mother     angina  . Cancer Father     leukemia  . Heart disease Father    Social History  Substance Use Topics  . Smoking status: Former Smoker -- 1.00 packs/day for 35 years    Types: Cigarettes    Start date: 07/02/1931    Quit date: 08/07/1967  . Smokeless tobacco: Never Used  . Alcohol Use: No    Review of Systems  Constitutional: Negative for fever and chills.  HENT: Negative for congestion.   Eyes: Negative for visual disturbance.  Respiratory: Negative for shortness of breath.   Cardiovascular: Negative for chest pain.  Gastrointestinal: Negative for vomiting and abdominal pain.  Genitourinary: Negative for dysuria and flank pain.  Musculoskeletal: Positive for gait problem. Negative for back pain, neck pain and neck stiffness.  Skin: Negative for rash.  Neurological: Positive for dizziness. Negative for light-headedness and headaches.  Allergies  Ciprofloxacin and Tamsulosin hcl  Home Medications   Prior to Admission medications   Medication Sig Start Date End Date Taking? Authorizing Provider  albuterol (PROVENTIL HFA;VENTOLIN HFA) 108 (90 BASE) MCG/ACT inhaler Inhale 2 puffs into the lungs every 6 (six) hours as needed for wheezing or shortness of breath.    Yes Historical Provider, MD  amLODipine (NORVASC) 2.5 MG tablet Take 1 tablet (2.5 mg total) by mouth 2 (two) times daily. 03/30/15  Yes Laqueta Linden, MD  aspirin 81 MG EC tablet Take 81 mg by mouth daily.     Yes Historical Provider, MD  atorvastatin (LIPITOR) 10 MG tablet Take 1 tablet by mouth daily. 03/07/14  Yes Historical Provider, MD   clopidogrel (PLAVIX) 75 MG tablet Take 1 tablet (75 mg total) by mouth daily. 03/30/15  Yes Laqueta Linden, MD  Fluticasone-Salmeterol (ADVAIR DISKUS) 250-50 MCG/DOSE AEPB Inhale 1 puff into the lungs 2 (two) times daily. 01/01/14  Yes Historical Provider, MD  Folic Acid-Vit B6-Vit B12 (FOLBEE) 2.5-25-1 MG TABS Take 1 tablet by mouth daily.     Yes Historical Provider, MD  gabapentin (NEURONTIN) 300 MG capsule Take 1 capsule by mouth daily. 01/26/14  Yes Historical Provider, MD  levothyroxine (SYNTHROID, LEVOTHROID) 75 MCG tablet Take 75 mcg by mouth daily before breakfast.   Yes Historical Provider, MD  loratadine (CLARITIN) 10 MG tablet Take 10 mg by mouth daily.     Yes Historical Provider, MD  meclizine (ANTIVERT) 12.5 MG tablet Take 12.5 mg by mouth 3 (three) times daily as needed for dizziness.  03/24/13  Yes Historical Provider, MD  Multiple Vitamin (MULTIVITAMIN) tablet Take 1 tablet by mouth daily.     Yes Historical Provider, MD  Multiple Vitamins-Minerals ([facility] MACULAR HEALTH PO) Take 1 capsule by mouth 2 (two) times daily.   Yes Historical Provider, MD  nitroGLYCERIN (NITROSTAT) 0.4 MG SL tablet Place 0.4 mg under the tongue every 5 (five) minutes as needed.     Yes Historical Provider, MD  Specialty Vitamins Products (PROSTATE) TABS Take 1 tablet by mouth daily.    Yes Historical Provider, MD   BP 137/77 mmHg  Pulse 77  Temp(Src) 97.9 F (36.6 C) (Oral)  Resp 18  SpO2 91% Physical Exam  Constitutional: He appears well-developed. No distress.  HENT:  Head: Normocephalic and atraumatic.  Mild dry mucous membranes  Eyes: Right eye exhibits no discharge.  Neck: Normal range of motion. Neck supple. No tracheal deviation present.  Cardiovascular: Normal rate and regular rhythm.   Pulmonary/Chest: Effort normal and breath sounds normal.  Abdominal: Soft. He exhibits no distension. There is no tenderness. There is no guarding.  Musculoskeletal: He exhibits tenderness. He exhibits no  edema.  Patient has tenderness left lateral forearm with dorsal ecchymosis and swelling, neurovascularly intact distal left arm. Patient is tenderness mild deformity left hip with tenderness upon flexion. No knee or ankle tenderness on exam. No shoulder or elbow tenderness on exam. No midline cervical tenderness for range of motion head and neck. No rib tenderness on the flanks.  Neurological: He is alert.  Patient alert and oriented, difficult exam due to difficulty hearing. Patient moves all extremities equal except left lower leg due to pain. Sensation grossly intact bilateral no facial droop. 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  Skin: Skin is warm. No rash noted.  Psychiatric: He has a normal mood and affect.  Nursing note and vitals reviewed.   ED Course  Procedures (including critical care time) Labs  Review Labs Reviewed  BASIC METABOLIC PANEL - Abnormal; Notable for the following:    Glucose, Bld 100 (*)    BUN 28 (*)    Creatinine, Ser 2.12 (*)    GFR calc non Af Amer 25 (*)    GFR calc Af Amer 29 (*)    All other components within normal limits  CBC WITH DIFFERENTIAL/PLATELET - Abnormal; Notable for the following:    RBC 3.97 (*)    Hemoglobin 12.3 (*)    HCT 36.8 (*)    All other components within normal limits  TROPONIN I - Abnormal; Notable for the following:    Troponin I 0.05 (*)    All other components within normal limits  URINALYSIS, ROUTINE W REFLEX MICROSCOPIC (NOT AT [facility]) - Abnormal; Notable for the following:    Protein, ur 100 (*)    All other components within normal limits  URINE MICROSCOPIC-ADD ON - Abnormal; Notable for the following:    Squamous Epithelial / LPF 0-5 (*)    Casts HYALINE CASTS (*)    All other components within normal limits  TROPONIN I    Imaging Review Dg Chest 1 View  10/02/2015  CLINICAL DATA:  Fall.  Left wrist and femur pain. EXAM: CHEST 1 VIEW COMPARISON:  08/19/2014 FINDINGS: There is hyperinflation of the lungs compatible with COPD.  Scarring at the left base. No acute airspace opacities or effusions. No acute bony abnormality. No pneumothorax. IMPRESSION: COPD.  Left basilar scarring.  No active disease. Electronically Signed   By: Charlett Nose M.D.   On: 10/02/2015 11:45   Dg Forearm Left  10/02/2015  CLINICAL DATA:  82 year old who became dizzy and fell at home earlier today, sustaining a laceration to the left forearm. Initial encounter. EXAM: LEFT FOREARM - 2 VIEW COMPARISON:  None. FINDINGS: Soft tissue swelling/hematoma involving the subcutaneous tissues of the volar aspect of the mid forearm. No acute fractures involving the radius or ulna. Visualized elbow joint and wrist joint intact with mild degenerative changes. Generalized osseous demineralization. IMPRESSION: No acute osseous abnormality.  Osseous demineralization. Electronically Signed   By: Hulan Saas M.D.   On: 10/02/2015 11:51   Dg Wrist Complete Left  10/02/2015  CLINICAL DATA:  Fall.  Left wrist pain. EXAM: LEFT WRIST - COMPLETE 3+ VIEW COMPARISON:  None. FINDINGS: Degenerative changes within the left wrist. No acute bony abnormality. Specifically, no fracture, subluxation, or dislocation. Soft tissues are intact. IMPRESSION: No acute bony abnormality. Electronically Signed   By: Charlett Nose M.D.   On: 10/02/2015 11:44   Dg Pelvis Portable  10/02/2015  CLINICAL DATA:  82 year old who fell at home earlier today and complains of left hip pain. EXAM: PORTABLE PELVIS 1-2 VIEWS COMPARISON:  Left hip images obtained concurrently. FINDINGS: Bilateral total hip arthroplasties with anatomic alignment and no complicating features. Osseous demineralization. No acute fractures involving the bony pelvis or either proximal femur. Sacroiliac joints and symphysis pubis intact. Mild degenerative changes involving the visualized lower lumbar spine. IMPRESSION: No acute osseous abnormality. Osseous demineralization. Bilateral total hip arthroplasties with anatomic alignment and  no complicating features. Electronically Signed   By: Hulan Saas M.D.   On: 10/02/2015 13:23   Dg Femur Min 2 Views Left  10/02/2015  CLINICAL DATA:  Fall, left femur pain EXAM: LEFT FEMUR 2 VIEWS COMPARISON:  None. FINDINGS: Prior left hip replacement. No acute bony abnormality. Specifically, no fracture, subluxation, or dislocation. Soft tissues are intact. IMPRESSION: No acute bony abnormality. Electronically Signed  By: Charlett Nose M.D.   On: 10/02/2015 11:46   I have personally reviewed and evaluated these images and lab results as part of my medical decision-making.   EKG Interpretation   Date/Time:  Sunday October 02 2015 09:58:58 EST Ventricular Rate:  59 PR Interval:  270 QRS Duration: 95 QT Interval:  513 QTC Calculation: 508 R Axis:   69 Text Interpretation:  Sinus rhythm Prolonged PR interval Anteroseptal  infarct, old Abnormal T, probable ischemia, widespread Prolonged QT  interval overall similar Confirmed by Sharae Zappulla  MD, Drinda Belgard ([id number]) on  10/02/2015 10:05:41 AM      MDM   Final diagnoses:  Fall, initial encounter  Dizziness  Rib contusion, left, initial encounter  AKI (acute kidney injury) (HCC)  Hypoxia   Patient presented after dizziness and fall without syncope. Clinical concern for possible left forearm and left hip fracture. Plan for cardiac screen with dizziness, blood work and admission to the hospital. Patient uses Dr. Charlann Boxer for orthopedics. On reassessment patient has no arm or hip pain. X-rays revealed no fracture.\Concern for hypoxia and now worsening left flank and rib pain. Concern clinically for rib fracture, spirometry ordered, CT chest for further details and plan for admission this patient is 85% on room air at baseline is 91%. Patient has no home oxygen.  Patient has mild troponin elevation patient has had this in the past. Plan for serial troponins.  The patients results and plan were reviewed and discussed.   Any x-rays performed were  independently reviewed by myself.   Differential diagnosis were considered with the presenting HPI.  Medications  fentaNYL (SUBLIMAZE) injection 25 mcg (25 mcg Intravenous Given 10/02/15 1044)  ipratropium-albuterol (DUONEB) 0.5-2.5 (3) MG/3ML nebulizer solution 3 mL (not administered)  0.9 %  sodium chloride infusion ( Intravenous New Bag/Given 10/02/15 1047)    Filed Vitals:   10/02/15 1041 10/02/15 1045 10/02/15 1100 10/02/15 1313  BP:  148/85 132/73 137/77  Pulse:  61 57 77  Temp: 97.9 F (36.6 C)     TempSrc: Oral     Resp:  SpO2:  92% 82% 91%    Final diagnoses:  Fall, initial encounter  Dizziness  Rib contusion, left, initial encounter  AKI (acute kidney injury) (HCC)  Hypoxia    Admission/ observation were discussed with the admitting physician, patient and/or family and they are comfortable with the plan.     Blane Ohara, MD 10/02/15 [phone]

## 2015-10-02 NOTE — ED Notes (Signed)
Pt 84% on RA

## 2015-10-03 ENCOUNTER — Encounter (HOSPITAL_COMMUNITY): Payer: Self-pay

## 2015-10-03 DIAGNOSIS — N189 Chronic kidney disease, unspecified: Secondary | ICD-10-CM

## 2015-10-03 DIAGNOSIS — S2232XA Fracture of one rib, left side, initial encounter for closed fracture: Secondary | ICD-10-CM

## 2015-10-03 DIAGNOSIS — E440 Moderate protein-calorie malnutrition: Secondary | ICD-10-CM

## 2015-10-03 DIAGNOSIS — N179 Acute kidney failure, unspecified: Principal | ICD-10-CM

## 2015-10-03 DIAGNOSIS — I480 Paroxysmal atrial fibrillation: Secondary | ICD-10-CM

## 2015-10-03 LAB — BASIC METABOLIC PANEL
Anion gap: 10 (ref 5–15)
BUN: 32 mg/dL — ABNORMAL HIGH (ref 6–20)
CO2: 22 mmol/L (ref 22–32)
Calcium: 8.5 mg/dL — ABNORMAL LOW (ref 8.9–10.3)
Chloride: 110 mmol/L (ref 101–111)
Creatinine, Ser: 2.22 mg/dL — ABNORMAL HIGH (ref 0.61–1.24)
GFR calc Af Amer: 28 mL/min — ABNORMAL LOW (ref >60)
GFR calc non Af Amer: 24 mL/min — ABNORMAL LOW (ref >60)
Glucose, Bld: 99 mg/dL (ref 65–99)
Potassium: 4.5 mmol/L (ref 3.5–5.1)
Sodium: 142 mmol/L (ref 135–145)

## 2015-10-03 LAB — CBC
HCT: 30.9 % — ABNORMAL LOW (ref 39.0–52.0)
Hemoglobin: 10.1 g/dL — ABNORMAL LOW (ref 13.0–17.0)
MCH: 30.1 pg (ref 26.0–34.0)
MCHC: 32.7 g/dL (ref 30.0–36.0)
MCV: 92 fL (ref 78.0–100.0)
PLATELETS: 138 10*3/uL — AB (ref 150–400)
RBC: 3.36 MIL/uL — ABNORMAL LOW (ref 4.22–5.81)
RDW: 14.6 % (ref 11.5–15.5)
WBC: 6.8 10*3/uL (ref 4.0–10.5)

## 2015-10-03 MED ORDER — HYDROCODONE-ACETAMINOPHEN 5-300 MG PO TABS: 1.0000 | ORAL_TABLET | Freq: Four times a day (QID) | ORAL | Status: AC | PRN

## 2015-10-03 MED ORDER — POLYETHYLENE GLYCOL 3350 17 G PO PACK: 17.0000 g | PACK | Freq: Every day | ORAL | Status: AC

## 2015-10-03 NOTE — Care Management Note (Signed)
Case Management Note  Patient Details  Name: TENNESSEE PERRA MRN: [id number] Date of Birth: June 04, 1920  Subjective/Objective:         Admitted with Acute on chronic kidney injury           Action/Plan:  Patient plans to return home with daughter. HHC choice offered, daughter chose [facility]; Tiffany with [facility] called for arrangements. DME ordered as requested - home 02 and wheelchair and to be delivered to room today prior to discharging home.  Expected Discharge Date:  10/05/15               Expected Discharge Plan:  Home w Home Health Services  Discharge planning Services  CM Consult  Choice offered to:  Adult Children  DME Arranged:  Oxygen, Wheelchair manual DME Agency:  [facility]  HH Arranged:  PT HH Agency:  [facility]  Status of Service:  In process, will continue to follow  Reola Mosher [phone] 10/03/2015, 10:28 AM

## 2015-10-03 NOTE — Progress Notes (Signed)
SATURATION QUALIFICATIONS: (This note is used to comply with regulatory documentation for home oxygen)  Patient Saturations on Room Air at Rest = 82%  Patient Saturations on Room Air while Ambulating = NT  Patient Saturations on 2 Liters of oxygen while Ambulating = 82%  Please briefly explain why patient needs home oxygen: De-sat with gait.  O2 increased to 90% on 3 L/min once sitting.

## 2015-10-03 NOTE — Evaluation (Addendum)
Physical Therapy Evaluation Patient Details Name: MABEL UNREIN MRN: [id number] DOB: 1919-12-23 Today's Date: 10/03/2015   History of Present Illness  82 year old male with difficulty hearing, abdominal aneurysm, atrial fibrillation, CAD, vascular disease, hypothyroidism, lives with family with support throughout the day presents after fall. Patient recalls falling felt a little dizzy prior to falling and landed on his left side.  L side hip (-) for fracture and (+) for 6th & 7th L rib fx. PMH: HOH, abdominal aneurysm, a fib, CAD, vascular disease, hypothyroidism.   Clinical Impression  Pt admitted with above diagnosis. Pt currently with functional limitations due to the deficits listed below (see PT Problem List).  Pt will benefit from skilled PT to increase their independence and safety with mobility to allow discharge to the venue listed below.  Pt with difficulty WB on L LE with gait.  Chair had to be brought behind him to sit prior to reaching final destination. Pt de-sat on 2 L/min to 82% and increased to 3 L/min to 90%.    Pt has 24 hour A from family and caregivers.  Daughter feels they can A him at current level as he broke R hip in October and they took care of him then.  Pt will need a w/c.  Daughter states they will be able to carry him into house via the w/c. They had to do this recently with pt's wife.  Daughter was educated on safe ways to hold w/c (not hold onto removable arm rests).  Educated on use of pillow for bracing during coughing due to rib fx.  Pt able to do this during session and was able to cough up some phlegm.  Pt HOH (hearing batteries dead) and unable to follow directions, but shook PT's hand at end and was appreciative of session.     Follow Up Recommendations Home health PT;Supervision/Assistance - 24 hour;Supervision for mobility/OOB    Equipment Recommendations  Wheelchair (measurements PT)    Recommendations for Other Services OT consult     Precautions /  Restrictions Precautions Precautions: Fall Precaution Comments: L LE pain      Mobility  Bed Mobility Overal bed mobility: Needs Assistance Bed Mobility: Supine to Sit     Supine to sit: Mod assist     General bed mobility comments: O2 dropped to 82% on RA. Increased o2 to 2 L/min and up to 89-90%.   Transfers Overall transfer level: Needs assistance   Transfers: Sit to/from Stand;Stand Pivot Transfers Sit to Stand: Mod assist Stand pivot transfers: Max assist       General transfer comment: Transfered with MAX bed > BSC then ambulated around bed.  Chair > recliner with MAX A.  Tactile cueing for hand placement and for hips due to [facility].  Ambulation/Gait Ambulation/Gait assistance: Mod assist Ambulation Distance (Feet): 12 Feet Assistive device: Rolling walker (2 wheeled) Gait Pattern/deviations: Decreased stance time - left;Decreased step length - right;Decreased step length - left;Step-to pattern;Antalgic;Trunk flexed     General Gait Details: Ambulated around bed with flexed posture and tactile cueing for posture.  Nearing recliner, pt started letting go of RW reaching for recliner and window sill and bed.  Brought standard chair behind pt to sit down due to increased flexion and increased fall risk.  Pt rested, then performed SPT to recliner with MAX A. o2 checked and 82%.  Increased to 3 L/min and slowly returned to 90%.  Stairs            Psychologist, prison and probation services  Modified Rankin (Stroke Patients Only)       Balance Overall balance assessment: History of Falls;Needs assistance   Sitting balance-Leahy Scale: Fair Sitting balance - Comments: Pt able to lean forward on BSC      Standing balance-Leahy Scale: Zero Standing balance comment: Increased flexion in hips/knees with gait.  In static standing, able to stand upright with cueing from daughter.                             Pertinent Vitals/Pain Pain Assessment: Faces Faces Pain Scale: Hurts  whole lot Pain Location: L LE with grimacing Pain Descriptors / Indicators: Grimacing Pain Intervention(s): Limited activity within patient's tolerance;Monitored during session;Repositioned    Home Living Family/patient expects to be discharged to:: Private residence Living Arrangements: Spouse/significant other Available Help at Discharge: Family;Available 24 hours/day Type of Home: House Home Access: Stairs to enter   [employer] of Steps: 2 Home Layout: Able to live on main level with bedroom/bathroom;Two level Home Equipment: Walker - 2 wheels;Bedside commode;Shower seat;Grab bars - toilet;Grab bars - tub/shower      Prior Function Level of Independence: Independent with assistive device(s)         Comments: RW when outside. No AD inside.     Hand Dominance   Dominant Hand: Right    Extremity/Trunk Assessment   Upper Extremity Assessment: Overall WFL for tasks assessed           Lower Extremity Assessment: Generalized weakness;LLE deficits/detail   LLE Deficits / Details: difficulty with WB on L LE during gait     Communication   Communication: HOH  Cognition Arousal/Alertness: Awake/alert Behavior During Therapy: WFL for tasks assessed/performed Overall Cognitive Status: History of cognitive impairments - at baseline                      General Comments General comments (skin integrity, edema, etc.): L eye enuculation, brusing L hip with slight deformity noted    Exercises        Assessment/Plan    PT Assessment Patient needs continued PT services  PT Diagnosis Difficulty walking;Generalized weakness;Acute pain   PT Problem List Decreased strength;Decreased activity tolerance;Decreased balance;Decreased mobility;Decreased coordination;Cardiopulmonary status limiting activity;Decreased knowledge of use of DME  PT Treatment Interventions DME instruction;Gait training;Functional mobility training;Therapeutic activities;Therapeutic  exercise;Balance training   PT Goals (Current goals can be found in the Care Plan section) Acute Rehab PT Goals Patient Stated Goal: to return home PT Goal Formulation: With family Time For Goal Achievement: 10/10/15 Potential to Achieve Goals: Fair    Frequency Min 3X/week   Barriers to discharge Inaccessible home environment stairs to enter- daughter states they will carry him up stairs in w/c. They have had to do this with pt's wife before.    Co-evaluation               End of Session Equipment Utilized During Treatment: Gait belt;Oxygen Activity Tolerance: Patient limited by fatigue;Patient limited by pain Patient left: in chair;with call bell/phone within reach;with family/visitor present;with nursing/sitter in room (daughter states she will not be leaving pt's side) Nurse Communication: Mobility status         Time: 1610-9604 PT Time Calculation (min) (ACUTE ONLY): 47 min   Charges:   PT Evaluation $PT Eval Moderate Complexity: 1 Procedure PT Treatments $Therapeutic Activity: 23-37 mins   PT G Codes:        Jadis Pitter LUBECK 10/03/2015, 9:56 AM

## 2015-10-03 NOTE — Discharge Summary (Signed)
Physician Discharge Summary  [name] [name] ZOX:[id number] DOB: 1919-11-23 DOA: 10/02/2015  PCP: Josue Hector, MD  Admit date: 10/02/2015 Discharge date: 10/03/2015  Time spent: 45 minutes  Recommendations for Outpatient Follow-up:  PCP in 1 week Please use incentive spirometry and local wound care to skin tear on L forearm  Discharge Diagnoses:  Principal Problem:   Fracture of rib of left side   CKD 3   hypoxia   Essential hypertension   ATRIAL FIBRILLATION, PAROXYSMAL   Thoraco abdominal aneurysm (HCC)   PVD (peripheral vascular disease) (HCC)   Hypothyroidism   Fall   Acute-on-chronic kidney injury (HCC)   Moderate protein-calorie malnutrition (HCC)   Hypoxia   Acute kidney failure (HCC)   Discharge Condition: stable  Diet recommendation: heart healthy  Filed Weights   10/02/15 1952 10/03/15 0354  Weight: 56.5 kg (124 lb 9 oz) 56.2 kg (123 lb 14.4 oz)    History of present illness:  Chief Complaint: Fall at home, dehydration, HPI: 82 year old male with history of CAD with NSTE MI, PVD, thoracic and abdominal aortic aneurysm (followed by Dr. [name]), hypertension, hyperlipidemia, CKD 3, hypothyroidism, mild dementia and very hard of hearing was admitted in January for chest pain and managed medically, pt lives at home with his wife and his 2 daughters close by had a fall at home and landed on his left side. He hit his left hip and left lateral ribs hard on the floor when he was going to the bathroom. Denied any loss of consciousness but reported feeling dizzy and may have tripped onto something.   Hospital Course:   Fall with left lateral rib fractures and left hip pain -Hip xrays negative for fracture -pt ambulated with Physical therapy and HH pt was recommended with DME and set up -for pain control advised tylenol and vicodin PRn for severe pain only -incentive spirometry used and advised after discharge -Home O2 required due to Lung splinting from rib  fractures/and inability to breath deeply, CT negative for pneumothorax, pneumonia  AKi on CKD -baseline creatinine 1.8-2, on admission it was 2.1 and today it is 2.2, pt is eating/drinking and otherwise feels well and his kidney function is close enough to his baseline  CAD with stable ischemic heart disease Continue aspirin, Plavix and Lipitor.  -No chest pain symptoms or EKG changes.  Paroxysmal A. fib In sinus rhythm. Continue aspirin  Essential hypertension Continue home medication   Thoraco abdominal aneurysm [facility]) Patient has a 6 cm AAA and a 4 cm thoracic-aortic aneurysm. Follows with vascular surgery Dr. [name]    PVD (peripheral vascular disease) (HCC) Continue Plavix and statin. Follows with Dr. [name].   Hypothyroidism Continue Synthroid.   Moderate protein-calorie malnutrition [facility]) Dietitian consulted, supplemented diet  Discharge Exam: Filed Vitals:   10/03/15 0354 10/03/15 0931  BP: 123/68 132/77  Pulse: 86 77  Temp: 98.9 F (37.2 C)   Resp: 18 18    General: alert, awake, very hard of hearing Cardiovascular: S1S2/RRR Respiratory: CTAB  Discharge Instructions   Discharge Instructions    Diet - low sodium heart healthy    Complete by:  As directed      Increase activity slowly    Complete by:  As directed           Current Discharge Medication List    START taking these medications   Details  Hydrocodone-Acetaminophen 5-300 MG TABS Take 1 tablet by mouth every 6 (six) hours as needed (for severe pain). Qty: 20 each,  Refills: 0    polyethylene glycol (MIRALAX / GLYCOLAX) packet Take 17 g by mouth daily. Qty: 14 each, Refills: 0      CONTINUE these medications which have NOT CHANGED   Details  albuterol (PROVENTIL HFA;VENTOLIN HFA) 108 (90 BASE) MCG/ACT inhaler Inhale 2 puffs into the lungs every 6 (six) hours as needed for wheezing or shortness of breath.     amLODipine (NORVASC) 2.5 MG tablet Take 1 tablet (2.5 mg total) by mouth  2 (two) times daily. Qty: 180 tablet, Refills: 3    aspirin 81 MG EC tablet Take 81 mg by mouth daily.      atorvastatin (LIPITOR) 10 MG tablet Take 1 tablet by mouth daily.    clopidogrel (PLAVIX) 75 MG tablet Take 1 tablet (75 mg total) by mouth daily. Qty: 90 tablet, Refills: 3    Fluticasone-Salmeterol (ADVAIR DISKUS) 250-50 MCG/DOSE AEPB Inhale 1 puff into the lungs 2 (two) times daily.    Folic Acid-Vit B6-Vit B12 (FOLBEE) 2.5-25-1 MG TABS Take 1 tablet by mouth daily.      gabapentin (NEURONTIN) 300 MG capsule Take 1 capsule by mouth daily.    levothyroxine (SYNTHROID, LEVOTHROID) 75 MCG tablet Take 75 mcg by mouth daily before breakfast.    loratadine (CLARITIN) 10 MG tablet Take 10 mg by mouth daily.      meclizine (ANTIVERT) 12.5 MG tablet Take 12.5 mg by mouth 3 (three) times daily as needed for dizziness.     Multiple Vitamin (MULTIVITAMIN) tablet Take 1 tablet by mouth daily.      Multiple Vitamins-Minerals (MH MACULAR HEALTH PO) Take 1 capsule by mouth 2 (two) times daily.    nitroGLYCERIN (NITROSTAT) 0.4 MG SL tablet Place 0.4 mg under the tongue every 5 (five) minutes as needed.      Specialty Vitamins Products (PROSTATE) TABS Take 1 tablet by mouth daily.        Allergies  Allergen Reactions  . Ciprofloxacin     REACTION: swelling of joints,mouth  . Tamsulosin Hcl Other (See Comments)    Lower Back pain   Follow-up Information    Follow up with Josue Hector, MD. Schedule an appointment as soon as possible for a visit in 1 week.   Specialty:  Family Medicine   Contact information:   [address] [place] [address] [phone]       Follow up with Please use Incentive spirometry every hour when awake for next 3-4days.      Follow up with Wound Care/to skin tear, daily.       The results of significant diagnostics from this hospitalization (including imaging, microbiology, ancillary and laboratory) are listed below for reference.     Significant Diagnostic Studies: Dg Chest 1 View  10/02/2015  CLINICAL DATA:  Fall.  Left wrist and femur pain. EXAM: CHEST 1 VIEW COMPARISON:  08/19/2014 FINDINGS: There is hyperinflation of the lungs compatible with COPD. Scarring at the left base. No acute airspace opacities or effusions. No acute bony abnormality. No pneumothorax. IMPRESSION: COPD.  Left basilar scarring.  No active disease. Electronically Signed   By: Charlett Nose M.D.   On: 10/02/2015 11:45   Dg Forearm Left  10/02/2015  CLINICAL DATA:  82 year old who became dizzy and fell at home earlier today, sustaining a laceration to the left forearm. Initial encounter. EXAM: LEFT FOREARM - 2 VIEW COMPARISON:  None. FINDINGS: Soft tissue swelling/hematoma involving the subcutaneous tissues of the volar aspect of the mid forearm. No acute fractures involving  the radius or ulna. Visualized elbow joint and wrist joint intact with mild degenerative changes. Generalized osseous demineralization. IMPRESSION: No acute osseous abnormality.  Osseous demineralization. Electronically Signed   By: Hulan Saas M.D.   On: 10/02/2015 11:51   Dg Wrist Complete Left  10/02/2015  CLINICAL DATA:  Fall.  Left wrist pain. EXAM: LEFT WRIST - COMPLETE 3+ VIEW COMPARISON:  None. FINDINGS: Degenerative changes within the left wrist. No acute bony abnormality. Specifically, no fracture, subluxation, or dislocation. Soft tissues are intact. IMPRESSION: No acute bony abnormality. Electronically Signed   By: Charlett Nose M.D.   On: 10/02/2015 11:44   Ct Chest Wo Contrast  10/02/2015  CLINICAL DATA:  81 year old male with fall, hypoxia and left chest pain. History of thoracic aortic aneurysm. EXAM: CT CHEST WITHOUT CONTRAST TECHNIQUE: Multidetector CT imaging of the chest was performed following the standard protocol without IV contrast. COMPARISON:  10/24/2011 and prior exams. FINDINGS: Mediastinum/Nodes: Coronary calcifications again noted. A distal thoracic  aortic aneurysm measuring 4.4 cm and greatest diameter is unchanged. Ectasia of the ascending aorta is noted measuring 3.8 cm in greatest diameter. There is no evidence of enlarged lymph nodes or pericardial effusion. Lungs/Pleura: Moderate to severe centrilobular emphysema is noted. There is no evidence of mass, airspace disease, suspicious nodule, consolidation or endobronchial/endotracheal lesion. No pleural effusion or pneumothorax identified. Upper abdomen: No acute abnormality Musculoskeletal: Acute nondisplaced fractures of lateral left sixth and seventh ribs noted. Mild degenerative disc disease within the thoracic spine noted. IMPRESSION: Acute nondisplaced fractures of the lateral left sixth seventh ribs. No evidence of pleural effusion or pneumothorax. Unchanged 4.4 cm distal thoracic aortic aneurysm and ascending thoracic aortic ectasia. Coronary artery disease and moderate to severe emphysema. Electronically Signed   By: Harmon Pier M.D.   On: 10/02/2015 15:34   Dg Pelvis Portable  10/02/2015  CLINICAL DATA:  82 year old who fell at home earlier today and complains of left hip pain. EXAM: PORTABLE PELVIS 1-2 VIEWS COMPARISON:  Left hip images obtained concurrently. FINDINGS: Bilateral total hip arthroplasties with anatomic alignment and no complicating features. Osseous demineralization. No acute fractures involving the bony pelvis or either proximal femur. Sacroiliac joints and symphysis pubis intact. Mild degenerative changes involving the visualized lower lumbar spine. IMPRESSION: No acute osseous abnormality. Osseous demineralization. Bilateral total hip arthroplasties with anatomic alignment and no complicating features. Electronically Signed   By: Hulan Saas M.D.   On: 10/02/2015 13:23   Dg Femur Min 2 Views Left  10/02/2015  CLINICAL DATA:  Fall, left femur pain EXAM: LEFT FEMUR 2 VIEWS COMPARISON:  None. FINDINGS: Prior left hip replacement. No acute bony abnormality. Specifically,  no fracture, subluxation, or dislocation. Soft tissues are intact. IMPRESSION: No acute bony abnormality. Electronically Signed   By: Charlett Nose M.D.   On: 10/02/2015 11:46    Microbiology: No results found for this or any previous visit (from the past 240 hour(s)).   Labs: Basic Metabolic Panel:  Recent Labs Lab 10/02/15 1037 10/03/15 0440  NA 141 142  K 4.1 4.5  CL 107 110  CO2 22 22  GLUCOSE 100* 99  BUN 28* 32*  CREATININE 2.12* 2.22*  CALCIUM 9.3 8.5*   Liver Function Tests: No results for input(s): AST, ALT, ALKPHOS, BILITOT, PROT, ALBUMIN in the last 168 hours. No results for input(s): LIPASE, AMYLASE in the last 168 hours. No results for input(s): AMMONIA in the last 168 hours. CBC:  Recent Labs Lab 10/02/15 1037 10/03/15 0440  WBC 7.2  6.8  NEUTROABS 4.6  --   HGB 12.3* 10.1*  HCT 36.8* 30.9*  MCV 92.7 92.0  PLT 150 138*   Cardiac Enzymes:  Recent Labs Lab 10/02/15 1037 10/02/15 1421  TROPONINI 0.05* 0.05*   BNP: BNP (last 3 results) No results for input(s): BNP in the last 8760 hours.  ProBNP (last 3 results) No results for input(s): PROBNP in the last 8760 hours.  CBG: No results for input(s): GLUCAP in the last 168 hours.     SignedZannie Cove MD.  [facility] 10/03/2015, 11:40 AM

## 2015-10-03 NOTE — Progress Notes (Signed)
Pt has orders to be discharged. Discharge instructions given and pt and pt family has no additional questions at this time. Medication regimen reviewed and pt educated. Pt family who cares for him verbalized understanding and has no additional questions. Telemetry box removed. IV removed and site in good condition. Pt stable and waiting for transportation.   Jilda Panda RN

## 2015-12-05 DEATH — deceased
